# Patient Record
Sex: Male | Born: 1950 | Race: White | Hispanic: No | Marital: Married | Smoking: Former smoker
Health system: Southern US, Community
[De-identification: ages and names within clinical notes are randomized; demographics above are authoritative.]

## PROBLEM LIST (undated history)

## (undated) DIAGNOSIS — K219 Gastro-esophageal reflux disease without esophagitis: Secondary | ICD-10-CM

## (undated) DIAGNOSIS — G459 Transient cerebral ischemic attack, unspecified: Secondary | ICD-10-CM

## (undated) DIAGNOSIS — E785 Hyperlipidemia, unspecified: Secondary | ICD-10-CM

## (undated) HISTORY — DX: Hyperlipidemia, unspecified: E78.5

## (undated) HISTORY — PX: COLONOSCOPY: SHX174

## (undated) HISTORY — PX: ESOPHAGOGASTRODUODENOSCOPY ENDOSCOPY: SHX5814

## (undated) HISTORY — PX: TONSILLECTOMY: SUR1361

## (undated) HISTORY — DX: Transient cerebral ischemic attack, unspecified: G45.9

## (undated) HISTORY — DX: Gastro-esophageal reflux disease without esophagitis: K21.9

## (undated) HISTORY — PX: CERVICAL SPINE SURGERY: SHX589

---

## 2001-09-04 ENCOUNTER — Ambulatory Visit (HOSPITAL_COMMUNITY): Admission: RE | Admit: 2001-09-04 | Discharge: 2001-09-04 | Payer: Self-pay | Admitting: Family Medicine

## 2001-09-04 ENCOUNTER — Encounter: Payer: Self-pay | Admitting: Family Medicine

## 2002-12-15 ENCOUNTER — Ambulatory Visit (HOSPITAL_COMMUNITY): Admission: RE | Admit: 2002-12-15 | Discharge: 2002-12-15 | Payer: Self-pay | Admitting: Family Medicine

## 2002-12-15 ENCOUNTER — Encounter: Payer: Self-pay | Admitting: Family Medicine

## 2005-01-17 ENCOUNTER — Encounter: Admission: RE | Admit: 2005-01-17 | Discharge: 2005-01-17 | Payer: Self-pay | Admitting: *Deleted

## 2005-12-26 ENCOUNTER — Ambulatory Visit: Payer: Self-pay | Admitting: Gastroenterology

## 2006-11-05 ENCOUNTER — Ambulatory Visit: Payer: Self-pay | Admitting: Gastroenterology

## 2006-11-19 ENCOUNTER — Ambulatory Visit: Payer: Self-pay | Admitting: Gastroenterology

## 2010-04-11 ENCOUNTER — Ambulatory Visit (HOSPITAL_BASED_OUTPATIENT_CLINIC_OR_DEPARTMENT_OTHER): Admission: RE | Admit: 2010-04-11 | Discharge: 2010-04-11 | Payer: Self-pay | Admitting: Orthopedic Surgery

## 2010-12-08 LAB — POCT HEMOGLOBIN-HEMACUE: Hemoglobin: 15.6 g/dL (ref 13.0–17.0)

## 2010-12-21 ENCOUNTER — Telehealth: Payer: Self-pay | Admitting: Cardiology

## 2011-01-28 ENCOUNTER — Ambulatory Visit
Admission: RE | Admit: 2011-01-28 | Discharge: 2011-01-28 | Disposition: A | Discharge status: Home / Self Care | Payer: 59 | Source: Ambulatory Visit | Attending: Internal Medicine | Admitting: Internal Medicine

## 2011-01-28 ENCOUNTER — Other Ambulatory Visit: Payer: Self-pay | Admitting: Internal Medicine

## 2011-01-28 DIAGNOSIS — R05 Cough: Secondary | ICD-10-CM

## 2011-01-28 DIAGNOSIS — R059 Cough, unspecified: Secondary | ICD-10-CM

## 2016-10-30 ENCOUNTER — Ambulatory Visit
Admission: RE | Admit: 2016-10-30 | Discharge: 2016-10-30 | Disposition: A | Discharge status: Home / Self Care | Payer: BLUE CROSS/BLUE SHIELD | Source: Ambulatory Visit | Attending: Internal Medicine | Admitting: Internal Medicine

## 2016-10-30 ENCOUNTER — Other Ambulatory Visit: Payer: Self-pay | Admitting: Internal Medicine

## 2016-10-30 DIAGNOSIS — J189 Pneumonia, unspecified organism: Secondary | ICD-10-CM

## 2016-12-31 ENCOUNTER — Encounter: Payer: Self-pay | Admitting: Gastroenterology

## 2017-01-27 ENCOUNTER — Emergency Department (HOSPITAL_COMMUNITY): Payer: BLUE CROSS/BLUE SHIELD

## 2017-01-27 ENCOUNTER — Emergency Department (HOSPITAL_COMMUNITY)
Admission: EM | Admit: 2017-01-27 | Discharge: 2017-01-27 | Disposition: A | Discharge status: Home / Self Care | Payer: BLUE CROSS/BLUE SHIELD | Attending: Emergency Medicine | Admitting: Emergency Medicine

## 2017-01-27 ENCOUNTER — Encounter (HOSPITAL_COMMUNITY): Payer: Self-pay

## 2017-01-27 DIAGNOSIS — R4182 Altered mental status, unspecified: Secondary | ICD-10-CM | POA: Insufficient documentation

## 2017-01-27 DIAGNOSIS — R791 Abnormal coagulation profile: Secondary | ICD-10-CM | POA: Diagnosis not present

## 2017-01-27 DIAGNOSIS — R51 Headache: Secondary | ICD-10-CM | POA: Diagnosis present

## 2017-01-27 LAB — I-STAT CHEM 8, ED
BUN: 26 mg/dL — ABNORMAL HIGH (ref 6–20)
CHLORIDE: 107 mmol/L (ref 101–111)
Calcium, Ion: 1.26 mmol/L (ref 1.15–1.40)
Creatinine, Ser: 1.1 mg/dL (ref 0.61–1.24)
GLUCOSE: 102 mg/dL — AB (ref 65–99)
HEMATOCRIT: 48 % (ref 39.0–52.0)
HEMOGLOBIN: 16.3 g/dL (ref 13.0–17.0)
POTASSIUM: 4.9 mmol/L (ref 3.5–5.1)
Sodium: 137 mmol/L (ref 135–145)
TCO2: 25 mmol/L (ref 0–100)

## 2017-01-27 LAB — CBG MONITORING, ED: Glucose-Capillary: 90 mg/dL (ref 65–99)

## 2017-01-27 LAB — COMPREHENSIVE METABOLIC PANEL
ALT: 26 U/L (ref 17–63)
AST: 24 U/L (ref 15–41)
Albumin: 4.3 g/dL (ref 3.5–5.0)
Alkaline Phosphatase: 53 U/L (ref 38–126)
Anion gap: 7 (ref 5–15)
BUN: 20 mg/dL (ref 6–20)
CHLORIDE: 108 mmol/L (ref 101–111)
CO2: 22 mmol/L (ref 22–32)
CREATININE: 1.04 mg/dL (ref 0.61–1.24)
Calcium: 9.8 mg/dL (ref 8.9–10.3)
GFR calc non Af Amer: >60 mL/min (ref >60)
Glucose, Bld: 106 mg/dL — ABNORMAL HIGH (ref 65–99)
POTASSIUM: 5 mmol/L (ref 3.5–5.1)
SODIUM: 137 mmol/L (ref 135–145)
Total Bilirubin: 1 mg/dL (ref 0.3–1.2)
Total Protein: 6.9 g/dL (ref 6.5–8.1)

## 2017-01-27 LAB — DIFFERENTIAL
BASOS ABS: 0 10*3/uL (ref 0.0–0.1)
BASOS PCT: 0 %
EOS ABS: 0 10*3/uL (ref 0.0–0.7)
Eosinophils Relative: 1 %
Lymphocytes Relative: 46 %
Lymphs Abs: 2.6 10*3/uL (ref 0.7–4.0)
Monocytes Absolute: 0.3 10*3/uL (ref 0.1–1.0)
Monocytes Relative: 5 %
NEUTROS ABS: 2.7 10*3/uL (ref 1.7–7.7)
Neutrophils Relative %: 48 %

## 2017-01-27 LAB — CBC
HCT: 45 % (ref 39.0–52.0)
Hemoglobin: 14.7 g/dL (ref 13.0–17.0)
MCH: 31.5 pg (ref 26.0–34.0)
MCHC: 32.7 g/dL (ref 30.0–36.0)
MCV: 96.4 fL (ref 78.0–100.0)
PLATELETS: 133 10*3/uL — AB (ref 150–400)
RBC: 4.67 MIL/uL (ref 4.22–5.81)
RDW: 13.7 % (ref 11.5–15.5)
WBC: 5.6 10*3/uL (ref 4.0–10.5)

## 2017-01-27 LAB — APTT: APTT: 25 s (ref 24–36)

## 2017-01-27 LAB — PROTIME-INR
INR: 1.01
PROTHROMBIN TIME: 13.3 s (ref 11.4–15.2)

## 2017-01-27 LAB — I-STAT TROPONIN, ED: TROPONIN I, POC: 0 ng/mL (ref 0.00–0.08)

## 2017-01-27 MED ORDER — SODIUM CHLORIDE 0.9 % IV BOLUS (SEPSIS)
1000.0000 mL | Freq: Once | INTRAVENOUS | Status: AC
  Administered 2017-01-27: 1000 mL via INTRAVENOUS

## 2017-01-27 NOTE — ED Notes (Signed)
Pt ambulated in hall with ease

## 2017-01-27 NOTE — ED Notes (Signed)
Pt is stable, A&Ox4, VSS. Pt left without going over paperwork.

## 2017-01-27 NOTE — ED Provider Notes (Signed)
[facility] DEPT Provider Note   CSN: [id number] Arrival date & time: 01/27/17  1538     History   Chief Complaint Chief Complaint  Patient presents with  . Code Stroke    HPI Mike Arias is a 66 y.o. male.   Illness  This is a new problem. The current episode started 3 to 5 hours ago. The problem occurs constantly. Progression since onset: improved. Associated symptoms include headaches. Pertinent negatives include no chest pain, no abdominal pain and no shortness of breath. Nothing aggravates the symptoms. The symptoms are relieved by lying down. He has tried nothing for the symptoms.    History reviewed. No pertinent past medical history.  There are no active problems to display for this patient.   History reviewed. No pertinent surgical history.     Home Medications    Prior to Admission medications   Not on File    Family History No family history on file.  Social History Social History  Substance Use Topics  . Smoking status: Never Smoker  . Smokeless tobacco: Never Used  . Alcohol use Yes     Comment: 4 days a week     Allergies   Patient has no known allergies.   Review of Systems Review of Systems  Respiratory: Negative for shortness of breath.   Cardiovascular: Negative for chest pain.  Gastrointestinal: Negative for abdominal pain.  Neurological: Positive for headaches.  All other systems reviewed and are negative.    Physical Exam Updated Vital Signs BP 131/85   Pulse (!) 52   Resp 14   SpO2 98%   Physical Exam  Constitutional: He is oriented to person, place, and time. He appears well-developed and well-nourished.  HENT:  Head: Normocephalic and atraumatic.  Eyes: Conjunctivae and EOM are normal. Pupils are equal, round, and reactive to light.  Neck: Normal range of motion.  Cardiovascular: Normal rate.   Pulmonary/Chest: Effort normal. No respiratory distress. He has no wheezes.  Abdominal: He exhibits no distension.    Musculoskeletal: Normal range of motion.  Neurological: He is alert and oriented to person, place, and time. A cranial nerve deficit (mild left facial droop most noticeable around mouth and nasolabial fold) is present.  No altered mental status, able to give full seemingly accurate history.  Face is assymmetric only around mouth (left nasolabial fold), EOM's intact, pupils equal and reactive, vision intact, tongue and uvula midline without deviation Upper and Lower extremity motor 5/5, intact pain perception in distal extremities, 2+ reflexes in biceps, patella and achilles tendons. Finger to nose normal, heel to shin normal. Walks without assistance or evident ataxia.    Nursing note and vitals reviewed.    ED Treatments / Results  Labs (all labs ordered are listed, but only abnormal results are displayed) Labs Reviewed  CBC - Abnormal; Notable for the following:       Result Value   Platelets 133 (*)    All other components within normal limits  COMPREHENSIVE METABOLIC PANEL - Abnormal; Notable for the following:    Glucose, Bld 106 (*)    All other components within normal limits  I-STAT CHEM 8, ED - Abnormal; Notable for the following:    BUN 26 (*)    Glucose, Bld 102 (*)    All other components within normal limits  PROTIME-INR  APTT  DIFFERENTIAL  CBG MONITORING, ED  I-STAT TROPOININ, ED  CBG MONITORING, ED    EKG  EKG Interpretation  Date/Time:  Monday  Jan 27 2017 15:51:00 EDT Ventricular Rate:  52 PR Interval:  206 QRS Duration: 104 QT Interval:  446 QTC Calculation: 414 R Axis:   16 Text Interpretation:  Sinus bradycardia Septal infarct , age undetermined Abnormal ECG No significant change since last tracing Confirmed by [facility] MD, Corene Cornea [phone]) on 01/27/2017 4:42:32 PM       Radiology Mr Brain Wo Contrast  Result Date: 01/27/2017 CLINICAL DATA:  Acute onset of headache, RIGHT eye blurriness, and altered sense of well-being. EXAM: MRI HEAD WITHOUT CONTRAST  TECHNIQUE: Multiplanar, multiecho pulse sequences of the brain and surrounding structures were obtained without intravenous contrast. COMPARISON:  CT head 01/27/2017. FINDINGS: The patient was unable to remain motionless for the exam. Small or subtle lesions could be overlooked. Brain: No evidence for acute infarction, hemorrhage, mass lesion, hydrocephalus, or extra-axial fluid. Mild atrophy. Mild subcortical and periventricular T2 and FLAIR hyperintensities, likely chronic microvascular ischemic change. Vascular: Flow voids are maintained throughout the carotid, basilar, and vertebral arteries. There are no areas of chronic hemorrhage. Skull and upper cervical spine: Unremarkable visualized calvarium, skullbase, and cervical vertebrae. Pituitary, pineal, cerebellar tonsils unremarkable. No upper cervical cord lesions. Sinuses/Orbits: No orbital masses or proptosis. Globes appear symmetric. Sinuses appear well aerated, without evidence for air-fluid level. Other: No nasopharyngeal pathology or mastoid fluid. Scalp and other visualized extracranial soft tissues grossly unremarkable. IMPRESSION: Mild atrophy. Mild subcortical and periventricular T2 and FLAIR hyperintensities, likely chronic microvascular ischemic change. No acute intracranial findings are evident. Good general agreement with prior noncontrast CT. Electronically Signed   By: Staci Righter M.D.   On: 01/27/2017 18:54   Ct Head Code Stroke W/o Cm  Result Date: 01/27/2017 CLINICAL DATA:  Code stroke. 66 year old male with altered mental status, confusion and blurred vision. Initial encounter. EXAM: CT HEAD WITHOUT CONTRAST TECHNIQUE: Contiguous axial images were obtained from the base of the skull through the vertex without intravenous contrast. COMPARISON:  Brain MRI 01/17/2005 FINDINGS: Brain: Cerebral volume remains normal for age. No midline shift, ventriculomegaly, mass effect, evidence of mass lesion, intracranial hemorrhage or evidence of  cortically based acute infarction. Gray-white matter differentiation is within normal limits throughout the brain. No cortical encephalomalacia. Vascular: Calcified atherosclerosis at the skull base. No suspicious intracranial vascular hyperdensity. Skull: No acute osseous abnormality identified. Sinuses/Orbits: Visualized paranasal sinuses and mastoids are stable and well pneumatized. Other: Visualized scalp soft tissues are within normal limits. Visualized orbit soft tissues are within normal limits. ASPECTS [facility] Stroke Program Early CT Score) - Ganglionic level infarction (caudate, lentiform nuclei, internal capsule, insula, M1-M3 cortex): 7 - Supraganglionic infarction (M4-M6 cortex): 3 Total score (0-10 with 10 being normal): 10 IMPRESSION: 1. Normal for age non contrast CT appearance of the brain. 2. ASPECTS is 10. 3. The above was relayed via text pager to T. Dakakni on 01/27/2017 at 16:18 . Electronically Signed   By: Genevie Ann M.D.   On: 01/27/2017 16:18    Procedures Procedures (including critical care time)  Medications Ordered in ED Medications  sodium chloride 0.9 % bolus 1,000 mL (1,000 mLs Intravenous New Bag/Given 01/27/17 1712)     Initial Impression / Assessment and Plan / ED Course  I have reviewed the triage vital signs and the nursing notes.  Pertinent labs & imaging results that were available during my care of the patient were reviewed by me and considered in my medical decision making (see chart for details).     Possibly volume related? Hasn't eaten, could be related. Does have  a left facial droop (mild) so will get MRI to ensure no CNS cause. Neuro already examined and thinks it not to be a code stroke.   Doubt stroke, but unsure of cause of symptoms, will need to continue following up with PCP for further workup.   Final Clinical Impressions(s) / ED Diagnoses   Final diagnoses:  Altered mental status, unspecified altered mental status type      Merrily Pew,  MD 01/27/17 1949

## 2017-01-27 NOTE — Code Documentation (Signed)
66 y.o. Male who states he was in his normal state of health this morning when he had an acute onset of H/A, right eye blurriness and "feeling weird". Patient states he was worried he might be having a stroke and decided to come to the ED to get checked out. Pt arrives to [facility] via POV. Code stroke called in triage. Patient met by stroke team in CT. CT showing no acute intracranial abnormalities. ASPECTS 10. NIHSS 1. VAN (-). See EMR for code stroke times and NIHSS. On assessment, patient with slight right sided facial droop which he reports as "normal", mild H/A and mild blurriness to the right eye. No other lateralizing or focal deficits appreciated. tPA not given d/t being too mild to treat. Code stroke canceled per neurologist. ED bedside handoff with ED RN Myriam Jacobson.

## 2017-01-27 NOTE — ED Triage Notes (Signed)
[facility] today and states at 1200 started feeling weird and saw friends but could not remember names, then right eye is blurry, then developed headache.  No extremity deficits.

## 2017-01-27 NOTE — ED Notes (Signed)
CBG is 90.  

## 2017-01-27 NOTE — ED Notes (Signed)
Patient transported to MRI 

## 2017-01-27 NOTE — ED Notes (Signed)
Code Stroke activated @ 0786

## 2017-01-27 NOTE — ED Notes (Signed)
All triage by Elmyra Ricks

## 2017-02-25 ENCOUNTER — Ambulatory Visit (AMBULATORY_SURGERY_CENTER): Payer: Self-pay | Admitting: *Deleted

## 2017-02-25 ENCOUNTER — Encounter: Payer: Self-pay | Admitting: Gastroenterology

## 2017-02-25 VITALS — Ht 74.0 in | Wt 206.3 lb

## 2017-02-25 DIAGNOSIS — Z1211 Encounter for screening for malignant neoplasm of colon: Secondary | ICD-10-CM

## 2017-02-25 MED ORDER — NA SULFATE-K SULFATE-MG SULF 17.5-3.13-1.6 GM/177ML PO SOLN: ORAL | 0 refills | Status: DC

## 2017-02-25 NOTE — Progress Notes (Signed)
Pt denies allergies to eggs or soy products. Denies difficulty with sedation or anesthesia. Denies any diet or weight loss medications. Denies use of supplemental oxygen.  Emmi instructions given for procedure.  

## 2017-03-11 ENCOUNTER — Encounter: Payer: Self-pay | Admitting: Gastroenterology

## 2017-03-11 ENCOUNTER — Ambulatory Visit (AMBULATORY_SURGERY_CENTER): Payer: BLUE CROSS/BLUE SHIELD | Admitting: Gastroenterology

## 2017-03-11 VITALS — BP 122/69 | HR 41 | Temp 98.6°F | Resp 9 | Ht 74.0 in | Wt 206.0 lb

## 2017-03-11 DIAGNOSIS — D128 Benign neoplasm of rectum: Secondary | ICD-10-CM

## 2017-03-11 DIAGNOSIS — D120 Benign neoplasm of cecum: Secondary | ICD-10-CM

## 2017-03-11 DIAGNOSIS — D123 Benign neoplasm of transverse colon: Secondary | ICD-10-CM

## 2017-03-11 DIAGNOSIS — D122 Benign neoplasm of ascending colon: Secondary | ICD-10-CM

## 2017-03-11 DIAGNOSIS — D124 Benign neoplasm of descending colon: Secondary | ICD-10-CM

## 2017-03-11 DIAGNOSIS — Z1212 Encounter for screening for malignant neoplasm of rectum: Secondary | ICD-10-CM | POA: Diagnosis not present

## 2017-03-11 DIAGNOSIS — Z1211 Encounter for screening for malignant neoplasm of colon: Secondary | ICD-10-CM

## 2017-03-11 MED ORDER — SODIUM CHLORIDE 0.9 % IV SOLN: 500.0000 mL | INTRAVENOUS | Status: DC

## 2017-03-11 NOTE — Op Note (Signed)
[facility] Patient Name: Mike Arias Procedure Date: 03/11/2017 10:12 AM MRN: [id number] Endoscopist: Mallie Mussel L. Loletha Carrow , MD Age: 66 Referring MD:  Date of Birth: April 14, 1951 Gender: Male Account #: [id number] Procedure:                Colonoscopy Indications:              Screening for colorectal malignant neoplasm (no                            polyps on 10/2006 colonoscopy) Medicines:                Monitored Anesthesia Care Procedure:                Pre-Anesthesia Assessment:                           - Prior to the procedure, a History and Physical                            was performed, and patient medications and                            allergies were reviewed. The patient's tolerance of                            previous anesthesia was also reviewed. The risks                            and benefits of the procedure and the sedation                            options and risks were discussed with the patient.                            All questions were answered, and informed consent                            was obtained. Prior Anticoagulants: The patient has                            taken no previous anticoagulant or antiplatelet                            agents. ASA Grade Assessment: II - A patient with                            mild systemic disease. After reviewing the risks                            and benefits, the patient was deemed in                            satisfactory condition to undergo the procedure.  After obtaining informed consent, the colonoscope                            was passed under direct vision. Throughout the                            procedure, the patient's blood pressure, pulse, and                            oxygen saturations were monitored continuously. The                            Model CF-HQ190L [id number]) scope was introduced                            through the anus and advanced to  the the cecum,                            identified by appendiceal orifice and ileocecal                            valve. The colonoscopy was performed without                            difficulty. The patient tolerated the procedure                            well. The quality of the bowel preparation was                            excellent after lavage. The ileocecal valve,                            appendiceal orifice, and rectum were photographed.                            The quality of the bowel preparation was evaluated                            using the BBPS [facility] Bowel Preparation Scale)                            with scores of: Right Colon = 3, Transverse Colon =                            3 and Left Colon = 3 (entire mucosa seen well with                            no residual staining, small fragments of stool or                            opaque liquid). The total BBPS score equals 9. The  bowel preparation used was SUPREP. Scope In: 10:27:31 AM Scope Out: 70:62:37 AM Scope Withdrawal Time: 0 hours 15 minutes 53 seconds  Total Procedure Duration: 0 hours 18 minutes 47 seconds  Findings:                 The perianal and digital rectal examinations were                            normal.                           Four sessile polyps were found in the descending                            colon, transverse colon, ascending colon and cecum.                            The polyps were 2 to 4 mm in size. These polyps                            were removed with a cold snare. Resection and                            retrieval were complete.                           A 8 mm polyp was found in the rectum. The polyp was                            semi-sessile. The polyp was removed with a hot                            snare. Resection and retrieval were complete.                           The exam was otherwise without abnormality on                             direct and retroflexion views. Complications:            No immediate complications. Estimated Blood Loss:     Estimated blood loss: none. Impression:               - Four 2 to 4 mm polyps in the descending colon, in                            the transverse colon, in the ascending colon and in                            the cecum, removed with a cold snare. Resected and                            retrieved.                           -  One 8 mm polyp in the rectum, removed with a hot                            snare. Resected and retrieved.                           - The examination was otherwise normal on direct                            and retroflexion views. Recommendation:           - Patient has a contact number available for                            emergencies. The signs and symptoms of potential                            delayed complications were discussed with the                            patient. Return to normal activities tomorrow.                            Written discharge instructions were provided to the                            patient.                           - Resume previous diet.                           - Continue present medications.                           - No aspirin, ibuprofen, naproxen, or other                            non-steroidal anti-inflammatory drugs for 5 days                            after polyp removal.                           - Await pathology results.                           - Repeat colonoscopy is recommended for                            surveillance. The colonoscopy date will be                            determined after pathology results from today's                            exam become  available for review. Henry L. Loletha Carrow, MD 03/11/2017 10:57:37 AM This report has been signed electronically.

## 2017-03-11 NOTE — Progress Notes (Signed)
Report to PACU, RN, vss, BBS= Clear.  

## 2017-03-11 NOTE — Patient Instructions (Signed)
  NO ASPIRIN, ASPIRIN PRODUCTS AND NSAIDS ( MOTRIN, ADVIL, IBUPROFEN, NAPROSYN, ALEVE ETC) FOR 5 DAYS, jUNE 24,2018   YOU HAD AN ENDOSCOPIC PROCEDURE TODAY AT [facility]:   Refer to the procedure report that was given to you for any specific questions about what was found during the examination.  If the procedure report does not answer your questions, please call your gastroenterologist to clarify.  If you requested that your care partner not be given the details of your procedure findings, then the procedure report has been included in a sealed envelope for you to review at your convenience later.  YOU SHOULD EXPECT: Some feelings of bloating in the abdomen. Passage of more gas than usual.  Walking can help get rid of the air that was put into your GI tract during the procedure and reduce the bloating. If you had a lower endoscopy (such as a colonoscopy or flexible sigmoidoscopy) you may notice spotting of blood in your stool or on the toilet paper. If you underwent a bowel prep for your procedure, you may not have a normal bowel movement for a few days.  Please Note:  You might notice some irritation and congestion in your nose or some drainage.  This is from the oxygen used during your procedure.  There is no need for concern and it should clear up in a day or so.  SYMPTOMS TO REPORT IMMEDIATELY:   Following lower endoscopy (colonoscopy or flexible sigmoidoscopy):  Excessive amounts of blood in the stool  Significant tenderness or worsening of abdominal pains  Swelling of the abdomen that is new, acute  Fever of 100F or higher   For urgent or emergent issues, a gastroenterologist can be reached at any hour by calling [phone].   DIET:  We do recommend a small meal at first, but then you may proceed to your regular diet.  Drink plenty of fluids but you should avoid alcoholic beverages for 24 hours.  ACTIVITY:  You should plan to take it easy for the rest of today  and you should NOT DRIVE or use heavy machinery until tomorrow (because of the sedation medicines used during the test).    FOLLOW UP: Our staff will call the number listed on your records the next business day following your procedure to check on you and address any questions or concerns that you may have regarding the information given to you following your procedure. If we do not reach you, we will leave a message.  However, if you are feeling well and you are not experiencing any problems, there is no need to return our call.  We will assume that you have returned to your regular daily activities without incident.  If any biopsies were taken you will be contacted by phone or by letter within the next 1-3 weeks.  Please call us at [phone] if you have not heard about the biopsies in 3 weeks.    SIGNATURES/CONFIDENTIALITY: You and/or your care partner have signed paperwork which will be entered into your electronic medical record.  These signatures attest to the fact that that the information above on your After Visit Summary has been reviewed and is understood.  Full responsibility of the confidentiality of this discharge information lies with you and/or your care-partner.

## 2017-03-12 ENCOUNTER — Telehealth: Payer: Self-pay | Admitting: *Deleted

## 2017-03-12 NOTE — Telephone Encounter (Signed)
   Follow up Call-  Call back number 03/11/2017  Post procedure Call Back phone  # [phone]  Permission to leave phone message Yes  Some recent data might be hidden     Patient questions:  Do you have a fever, pain , or abdominal swelling? No. Pain Score  0 *  Have you tolerated food without any problems? Yes.    Have you been able to return to your normal activities? Yes.    Do you have any questions about your discharge instructions: Diet   No. Medications  No. Follow up visit  No.  Do you have questions or concerns about your Care? No.  Actions: * If pain score is 4 or above: No action needed, pain <4.

## 2017-03-14 ENCOUNTER — Encounter: Payer: Self-pay | Admitting: Gastroenterology

## 2018-06-08 DIAGNOSIS — M25561 Pain in right knee: Secondary | ICD-10-CM | POA: Insufficient documentation

## 2018-06-10 DIAGNOSIS — M1712 Unilateral primary osteoarthritis, left knee: Secondary | ICD-10-CM | POA: Insufficient documentation

## 2018-06-10 DIAGNOSIS — M1711 Unilateral primary osteoarthritis, right knee: Secondary | ICD-10-CM | POA: Insufficient documentation

## 2018-07-02 DIAGNOSIS — M1611 Unilateral primary osteoarthritis, right hip: Secondary | ICD-10-CM | POA: Insufficient documentation

## 2018-07-02 DIAGNOSIS — M7070 Other bursitis of hip, unspecified hip: Secondary | ICD-10-CM | POA: Insufficient documentation

## 2018-07-02 DIAGNOSIS — M25559 Pain in unspecified hip: Secondary | ICD-10-CM | POA: Insufficient documentation

## 2018-12-04 ENCOUNTER — Ambulatory Visit (INDEPENDENT_AMBULATORY_CARE_PROVIDER_SITE_OTHER)
Admission: RE | Admit: 2018-12-04 | Discharge: 2018-12-04 | Disposition: A | Discharge status: Home / Self Care | Payer: Medicare HMO | Source: Ambulatory Visit | Attending: Cardiovascular Disease | Admitting: Cardiovascular Disease

## 2018-12-04 ENCOUNTER — Other Ambulatory Visit: Payer: Self-pay

## 2018-12-04 ENCOUNTER — Ambulatory Visit (HOSPITAL_COMMUNITY)
Admission: RE | Admit: 2018-12-04 | Discharge: 2018-12-04 | Disposition: A | Discharge status: Home / Self Care | Payer: Medicare HMO | Source: Ambulatory Visit | Attending: Cardiology | Admitting: Cardiology

## 2018-12-04 ENCOUNTER — Telehealth: Payer: Self-pay

## 2018-12-04 DIAGNOSIS — G459 Transient cerebral ischemic attack, unspecified: Secondary | ICD-10-CM

## 2018-12-04 NOTE — Telephone Encounter (Signed)
Patient reports having an episode on Monday where he was driving with one of his longtime friends to a conference to give a speech and suddenly became unable to verbalize his name. He states that it lasted for about 15 minutes and then resolved. Patient states that his vision was a little blurry at that time and shortly after. Patient denies having HA or any changes in his speech or strength or any other Sx. Patient denies having any Sx at this time and states that he is back to his regular self. Patient states that he has been taking ASA. Arranged for patient to have Non-Contrast Head CT and Carotid US today. Arranged for patient to have echo  on 3/16 at 7:30 AM and follow up appointment with Dr. Acie Fredrickson same day at 10:40 AM. Strict ER precautions given to patient. Patient verbalized understanding.

## 2018-12-06 ENCOUNTER — Encounter: Payer: Self-pay | Admitting: Cardiovascular Disease

## 2018-12-06 NOTE — Progress Notes (Signed)
Cardiology Office Note:    Date:  12/07/2018   ID:  Mike Arias, DOB 05/23/1951, MRN [id number]  PCP:  Levin Erp, MD  Cardiologist:  Mertie Moores, MD  Electrophysiologist:  None   Referring MD: Levin Erp, MD   Chief Complaint  Patient presents with  . Cerebrovascular Accident     December 07, 2018    Mike Arias is a 68 y.o. male with a recent  hx of a TIA. He is self referred ( friend and neighbor of Mike Arias, [place])   Mike Arias had an episode of inability to speak correctly  1 week ago The symptoms lasted 15 minutes - 30 min  and have not returned No weakness or numbness A few min of blurry vision No Cp , no dyspnea.    Lipids are elevated.   Checks his BP at the [facility] ,  Is occasionally elevated  Does not eat much salt  On no meds   Swam his 1.25 miles the next day without any issues   CT of the head was negative.  Carotid duplex revealed mild plaque.  [place], , heating oil and propane .     Went to [employer].  Swam  stuideied busienss     Past Medical History:  Diagnosis Date  . GERD (gastroesophageal reflux disease)     Past Surgical History:  Procedure Laterality Date  . CERVICAL SPINE SURGERY    . COLONOSCOPY    . ESOPHAGOGASTRODUODENOSCOPY ENDOSCOPY    . TONSILLECTOMY      Current Medications: Current Meds  Medication Sig  . aspirin 325 MG tablet Take 325 mg by mouth daily.   Current Facility-Administered Medications for the 12/07/18 encounter (Office Visit) with Soham Hollett, Wonda Cheng, MD  Medication  . 0.9 %  sodium chloride infusion     Allergies:   Patient has no known allergies.   Social History   Socioeconomic History  . Marital status: Married    Spouse name: Not on file  . Number of children: Not on file  . Years of education: Not on file  . Highest education level: Not on file  Occupational History  . Not on file  Social Needs  . Financial resource strain: Not on file  . Food insecurity:    Worry: Not on file   Inability: Not on file  . Transportation needs:    Medical: Not on file    Non-medical: Not on file  Tobacco Use  . Smoking status: Never Smoker  . Smokeless tobacco: Never Used  Substance and Sexual Activity  . Alcohol use: Yes    Comment: 4 days a week  . Drug use: No  . Sexual activity: Not on file  Lifestyle  . Physical activity:    Days per week: Not on file    Minutes per session: Not on file  . Stress: Not on file  Relationships  . Social connections:    Talks on phone: Not on file    Gets together: Not on file    Attends religious service: Not on file    Active member of club or organization: Not on file    Attends meetings of clubs or organizations: Not on file    Relationship status: Not on file  Other Topics Concern  . Not on file  Social History Narrative  . Not on file     Family History: The patient's family history includes Lung cancer in his father. There is no history  of Colon cancer.  ROS:   Please see the history of present illness.     All other systems reviewed and are negative.  EKGs/Labs/Other Studies Reviewed:    The following studies were reviewed today:   EKG: December 07, 2018: Sinus bradycardia 51 beats minute.  Otherwise normal EKG.  Recent Labs: No results found for requested labs within last 8760 hours.  Recent Lipid Panel No results found for: CHOL, TRIG, HDL, CHOLHDL, VLDL, LDLCALC, LDLDIRECT  Physical Exam:    VS:  BP 140/82   Pulse (!) 51   Ht 6\' 2"  (1.88 m)   Wt 203 lb 3.2 oz (92.2 kg)   SpO2 99%   BMI 26.09 kg/m     Wt Readings from Last 3 Encounters:  12/07/18 203 lb 3.2 oz (92.2 kg)  03/11/17 206 lb (93.4 kg)  02/25/17 206 lb 4.8 oz (93.6 kg)     GEN:  Well nourished, well developed in no acute distress HEENT: Normal NECK: No JVD; No carotid bruits LYMPHATICS: No lymphadenopathy CARDIAC: RRR, no murmurs, rubs, gallops RESPIRATORY:  Clear to auscultation without rales, wheezing or rhonchi  ABDOMEN: Soft,  non-tender, non-distended MUSCULOSKELETAL:  No edema; No deformity  SKIN: Warm and dry NEUROLOGIC:  Alert and oriented x 3 PSYCHIATRIC:  Normal affect   ASSESSMENT:    1. TIA (transient ischemic attack)   2. Mixed hyperlipidemia    PLAN:    In order of problems listed above:  1. TIA - Mike Arias  had a TIA last Monday.  Urgent noncontrast CT of the head looked unremarkable.  Carotid duplex scan revealed mild bilateral rotted plaques.  His symptoms have completely resolved.  Like to discuss with neuro as to the next step in work-up.  And to see if we should continue high-dose aspirin or perhaps should try Plavix.  Has a history of hyperlipidemia which we will start treating today.  2. Hyperlipidemia : His last lipids were very elevated..  In March, his total cholesterol was 245.  The LDL is 177.  The HDL is 52.  His triglyceride level 62.  Will start him on Crestor 10 mg a day.  We will check labs in 3 months.  I will see him again in 3 months for follow-up visit.   Medication Adjustments/Labs and Tests Ordered: Current medicines are reviewed at length with the patient today.  Concerns regarding medicines are outlined above.  Orders Placed This Encounter  Procedures  . Lipid Profile  . Basic Metabolic Panel (BMET)  . Hepatic function panel  . EKG 12-Lead   Meds ordered this encounter  Medications  . rosuvastatin (CRESTOR) 10 MG tablet    Sig: Take 1 tablet (10 mg total) by mouth daily.    Dispense:  90 tablet    Refill:  3    Patient Instructions  Medication Instructions:  Your physician has recommended you make the following change in your medication:  START Crestor (Rosuvastatin) 10 mg once daily  If you need a refill on your cardiac medications before your next appointment, please call your pharmacy.   Lab work: Your physician recommends that you return for lab work in: 3 months on the day of or a few days before your office visit with Dr. Acie Fredrickson.  You will need to  FAST for this appointment - nothing to eat or drink after midnight the night before except water.    Testing/Procedures: None Ordered    Follow-Up: At [facility], you and your health needs are  our priority.  As part of our continuing mission to provide you with exceptional heart care, we have created designated Provider Care Teams.  These Care Teams include your primary Cardiologist (physician) and Advanced Practice Providers (APPs -  Physician Assistants and Nurse Practitioners) who all work together to provide you with the care you need, when you need it. You will need a follow up appointment in:  3 months.  Please call our office 2 months in advance to schedule this appointment.  You may see Mertie Moores, MD or one of the following Advanced Practice Providers on your designated Care Team: Richardson Dopp, PA-C [place], [place] . Daune Perch, NP      Signed, Mertie Moores, MD  12/07/2018 11:25 AM    [place]

## 2018-12-07 ENCOUNTER — Ambulatory Visit (HOSPITAL_COMMUNITY): Payer: Medicare HMO | Attending: Cardiology

## 2018-12-07 ENCOUNTER — Encounter: Payer: Self-pay | Admitting: Cardiovascular Disease

## 2018-12-07 ENCOUNTER — Ambulatory Visit: Payer: Medicare HMO | Admitting: Cardiovascular Disease

## 2018-12-07 ENCOUNTER — Other Ambulatory Visit: Payer: Self-pay

## 2018-12-07 VITALS — BP 140/82 | HR 51 | Ht 74.0 in | Wt 203.2 lb

## 2018-12-07 DIAGNOSIS — E782 Mixed hyperlipidemia: Secondary | ICD-10-CM

## 2018-12-07 DIAGNOSIS — G459 Transient cerebral ischemic attack, unspecified: Secondary | ICD-10-CM

## 2018-12-07 MED ORDER — ROSUVASTATIN CALCIUM 10 MG PO TABS: 10.0000 mg | ORAL_TABLET | Freq: Every day | ORAL | 3 refills | Status: DC

## 2018-12-07 NOTE — Patient Instructions (Addendum)
Medication Instructions:  Your physician has recommended you make the following change in your medication:  START Crestor (Rosuvastatin) 10 mg once daily  If you need a refill on your cardiac medications before your next appointment, please call your pharmacy.    Lab work: Your physician recommends that you return for lab work in: 3 months on the day of or a few days before your office visit with Dr. Acie Fredrickson.  You will need to FAST for this appointment - nothing to eat or drink after midnight the night before except water.    Testing/Procedures: None Ordered    Follow-Up: Your physician recommends that you return for a follow-up appointment on Tuesday June 9 at 8:40 am with Dr. Acie Fredrickson

## 2018-12-09 ENCOUNTER — Telehealth: Payer: Self-pay | Admitting: Neurology

## 2018-12-09 NOTE — Telephone Encounter (Signed)
Patient scheduled Friday at noon, spoke to Dr Acie Fredrickson, referral for TIA.

## 2018-12-11 ENCOUNTER — Encounter: Payer: Self-pay | Admitting: Neurology

## 2018-12-11 ENCOUNTER — Ambulatory Visit: Payer: Medicare HMO | Admitting: Neurology

## 2018-12-11 ENCOUNTER — Telehealth: Payer: Self-pay | Admitting: Neurology

## 2018-12-11 ENCOUNTER — Other Ambulatory Visit: Payer: Self-pay

## 2018-12-11 VITALS — BP 128/78 | HR 53 | Ht 72.0 in | Wt 201.0 lb

## 2018-12-11 DIAGNOSIS — R400 Somnolence: Secondary | ICD-10-CM

## 2018-12-11 DIAGNOSIS — R0683 Snoring: Secondary | ICD-10-CM

## 2018-12-11 DIAGNOSIS — G459 Transient cerebral ischemic attack, unspecified: Secondary | ICD-10-CM | POA: Diagnosis not present

## 2018-12-11 DIAGNOSIS — F4489 Other dissociative and conversion disorders: Secondary | ICD-10-CM | POA: Diagnosis not present

## 2018-12-11 DIAGNOSIS — Z8489 Family history of other specified conditions: Secondary | ICD-10-CM

## 2018-12-11 DIAGNOSIS — R4701 Aphasia: Secondary | ICD-10-CM

## 2018-12-11 NOTE — Telephone Encounter (Signed)
Called the patient about todays apt. He is scheduled to come in for apt at 60. I called to give the patient the option to come in at a later time given his age and increased risk for exposure. lvm giving the patient the option and requested to call us and let us know what he would prefer.

## 2018-12-11 NOTE — Telephone Encounter (Signed)
Pt called in and said he will be here at 11:30 today and wants to keep his appt dg

## 2018-12-11 NOTE — Progress Notes (Signed)
[facility]    Provider:  Dr Jaynee Eagles Requesting Provider: Dr. Mertie Moores Primary Care Provider:  Levin Erp, MD  CC:  TIA  HPI:  Mike Arias is a 68 y.o. male here as requested by Dr. Mertie Moores for TIA.  Past medical history sinus bradycardia 51 bpm otherwise normal EKG.  He was started on Crestor.  He is on aspirin high-dose 325 mg. 3 years ao he was riding down the road and something wasn't right, had a similar episode of aphasia. Several weeks ago has a similar incident: He was on his way to work very similar, became forgetful. He was riding and driving, he wasn't able to respond, he forgot his colleagues name. Was still able to drive, he remembers the whole episode, he couldn;t carry on a conversation. He could talk but he felt like it was uncomfortable for him to say hello for example, but he could talk but not comprehending. He went into thie meeting, didn;t know people's names, didn't comprehend a lot but by the time the keeting was over he was fine. Lasted about 15 minutes. No one even noticed, maybe one person looked at him. No other focal neurologic deficits. Lasted 5-20 minutes. Had exactly the same incident several years ago. Was not on aspirin until last Monday. He was also just started on cholesterol medication. Incident several years ago was exactly the same. No shortness of breath, no lightheadedness. Family with a history seizures, brother and sister. No other focal neurologic deficits, associated symptoms, inciting events or modifiable factors.   Reviewed notes, labs and imaging from outside physicians, which showed:  Pending BMP, lipid profile.  No hemoglobin A1c.  Reviewed Dr. Arnette Norris Nahser's notes.  Patient had an episode of inability to speak correctly a week prior to being seen December 07, 2018.  The symptoms lasted 15 to 30 minutes and have not returned.  No weakness or numbness.  A few minutes of blurry vision.  No chest pain, no dyspnea.   Cholesterol is elevated.  Blood pressure is occasionally elevated as well per patient.  Does not eat much salt.  He swam 1.25 miles the next day without any issues.  CT of the head was negative.  Carotid duplex revealed mild plaque.  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   CT head 12/04/2018: showed No acute intracranial abnormalities including mass lesion or mass effect, hydrocephalus, extra-axial fluid collection, midline shift, hemorrhage, or acute infarction, large ischemic events (personally reviewed images). +Small-vessel  Whit matter disease. MRI in the past 2018 consistent with above findings as well.      Review of Systems: Patient complains of symptoms per HPI as well as the following symptoms: no CP, no SOB. Pertinent negatives and positives per HPI. All others negative.   Social History   Socioeconomic History   Marital status: Married    Spouse name: Not on file   Number of children: Not on file   Years of education: Not on file   Highest education level: Not on file  Occupational History   Not on file  Social Needs   Financial resource strain: Not on file   Food insecurity:    Worry: Not on file    Inability: Not on file   Transportation needs:    Medical: Not on file    Non-medical: Not on file  Tobacco Use   Smoking status: Never Smoker   Smokeless tobacco: Never Used   Tobacco comment: maybe a cigar  on occasion  Substance and Sexual Activity   Alcohol use: Yes    Comment: 4 days a week   Drug use: No   Sexual activity: Not on file  Lifestyle   Physical activity:    Days per week: Not on file    Minutes per session: Not on file   Stress: Not on file  Relationships   Social connections:    Talks on phone: Not on file    Gets together: Not on file    Attends religious service: Not on file    Active member of club or organization: Not on file    Attends meetings of clubs or organizations: Not on file    Relationship status:  Not on file   Intimate partner violence:    Fear of current or ex partner: Not on file    Emotionally abused: Not on file    Physically abused: Not on file    Forced sexual activity: Not on file  Other Topics Concern   Not on file  Social History Narrative   Not on file    Family History  Problem Relation Age of Onset   Lung cancer Father    Colon cancer Neg Hx     Past Medical History:  Diagnosis Date   GERD (gastroesophageal reflux disease)    HLD (hyperlipidemia)    TIA (transient ischemic attack)    ? TIA    Patient Active Problem List   Diagnosis Date Noted   TIA (transient ischemic attack) 12/14/2018    Past Surgical History:  Procedure Laterality Date   CERVICAL SPINE SURGERY     COLONOSCOPY     ESOPHAGOGASTRODUODENOSCOPY ENDOSCOPY     TONSILLECTOMY      Current Outpatient Medications  Medication Sig Dispense Refill   aspirin 325 MG tablet Take 325 mg by mouth daily.     rosuvastatin (CRESTOR) 10 MG tablet Take 1 tablet (10 mg total) by mouth daily. 90 tablet 3   Current Facility-Administered Medications  Medication Dose Route Frequency Provider Last Rate Last Dose   0.9 %  sodium chloride infusion  500 mL Intravenous Continuous Nelida Meuse III, MD        Allergies as of 12/11/2018   (No Known Allergies)    Vitals: BP 128/78    Pulse (!) 53    Ht 6' (1.829 m)    Wt 201 lb (91.2 kg)    BMI 27.26 kg/m  Last Weight:  Wt Readings from Last 1 Encounters:  12/11/18 201 lb (91.2 kg)   Last Height:   Ht Readings from Last 1 Encounters:  12/11/18 6' (1.829 m)     Physical exam: Exam: Gen: NAD, conversant, well nourised, overweight , well groomed                     CV: RRR, no MRG. No Carotid Bruits. No peripheral edema, warm, nontender Eyes: Conjunctivae clear without exudates or hemorrhage  Neuro: Detailed Neurologic Exam  Speech:    Speech is normal; fluent and spontaneous with normal comprehension.  Cognition:    The  patient is oriented to person, place, and time;     recent and remote memory intact;     language fluent;     normal attention, concentration,     fund of knowledge Cranial Nerves:    The pupils are equal, round, and reactive to light. Attempted fundoscopy could not visualize due to small pupils. Visual fields are full to  finger confrontation. Extraocular movements are intact. Trigeminal sensation is intact and the muscles of mastication are normal. The face is symmetric. The palate elevates in the midline. Hearing intact. Voice is normal. Shoulder shrug is normal. The tongue has normal motion without fasciculations.   Coordination:    Normal finger to nose   Gait:    Normal native gait  Motor Observation:    No asymmetry, no atrophy, and no involuntary movements noted. Tone:    Normal muscle tone.    Posture:    Posture is normal. normal erect    Strength:    Strength is V/V in the upper and lower limbs.      Sensation: intact to LT     Reflex Exam:  DTR's:    Deep tendon reflexes in the upper and lower extremities are symmetrical bilaterally.   Toes:    The toes are downgoing bilaterally.   Clonus:    Clonus is absent.    Assessment/Plan:  68 y.o. male here as requested by Dr. Mertie Moores for TIA.  Past medical history sinus bradycardia 51 bpm otherwise normal EKG.  He was started on Crestor.  He is on aspirin high-dose 325 mg. He has had 2 episodes over the last 3 years of 15-20 minutes of expressive aphasia and possibly some confusion (couldn't remember his colleagues name) and some receptive aphasia (couldn't understand what was being said in the meetings). Lasted 15 minutes. Denies any loss of awareness or consciousness an was still able to walk and navigate and speak sparsely but clearly.  Appears to be TIA and given cortical symptoms would suspect embolic source. However very unusual that he would have repeated episodes of the same exact symptoms if was TIA caused by  embolic source. Need to consider Seizure as well.  30-day heart monitor for afib (and may consider loop recorder) Routine EEG (He declines extended ambulatory EEG) MRI of the brain and MRA of the head Sleep Apnea referral - ESS 10, risk factor for stroke pcp Dr. Nyoka Cowden will get lab results Goal ldl < 70 Goal Blood Pressure 120/80 Continue aspirin and crestor  Discussed: I had a long d/w patient about his recent TIA, risk for recurrent stroke/TIAs, personally independently reviewed imaging studies and stroke evaluation results and answered questions.Continue ASA for secondary stroke prevention and maintain strict control of hypertension with blood pressure goal below 130/90, diabetes with hemoglobin A1c goal below 6.5% and lipids with LDL cholesterol goal below 70 mg/dL. I also advised the patient to eat a healthy diet with plenty of whole grains, cereals, fruits and vegetables, exercise regularly and maintain ideal body weight.     Orders Placed This Encounter  Procedures   MR BRAIN W WO CONTRAST   MR MRA HEAD WO CONTRAST   Ambulatory referral to Sleep Studies   CARDIAC EVENT MONITOR   EEG     Cc: Levin Erp, MD,  Dr. Acie Fredrickson, MD  Sarina Ill, MD  [facility] [address] [place], [place] [address]  Phone [phone] Fax [phone]

## 2018-12-11 NOTE — Patient Instructions (Addendum)
Routine EEG in the office (then possibly a extended eeg) 30-day heart monitor (and may consider loop recorder) MRI of the brain and MRA of the head Sleep Apnea referral possibly pcp Dr. Nyoka Cowden will get lab results Goal ldl < 70 Goal Blood Pressure 120/80 Continue aspirin and crestor  Sleep Apnea Sleep apnea is a condition in which breathing pauses or becomes shallow during sleep. Episodes of sleep apnea usually last 10 seconds or longer, and they may occur as many as 20 times an hour. Sleep apnea disrupts your sleep and keeps your body from getting the rest that it needs. This condition can increase your risk of certain health problems, including:  Heart attack.  Stroke.  Obesity.  Diabetes.  Heart failure.  Irregular heartbeat. There are three kinds of sleep apnea:  Obstructive sleep apnea. This kind is caused by a blocked or collapsed airway.  Central sleep apnea. This kind happens when the part of the brain that controls breathing does not send the correct signals to the muscles that control breathing.  Mixed sleep apnea. This is a combination of obstructive and central sleep apnea. What are the causes? The most common cause of this condition is a collapsed or blocked airway. An airway can collapse or become blocked if:  Your throat muscles are abnormally relaxed.  Your tongue and tonsils are larger than normal.  You are overweight.  Your airway is smaller than normal. What increases the risk? This condition is more likely to develop in people who:  Are overweight.  Smoke.  Have a smaller than normal airway.  Are elderly.  Are male.  Drink alcohol.  Take sedatives or tranquilizers.  Have a family history of sleep apnea. What are the signs or symptoms? Symptoms of this condition include:  Trouble staying asleep.  Daytime sleepiness and tiredness.  Irritability.  Loud snoring.  Morning headaches.  Trouble  concentrating.  Forgetfulness.  Decreased interest in sex.  Unexplained sleepiness.  Mood swings.  Personality changes.  Feelings of depression.  Waking up often during the night to urinate.  Dry mouth.  Sore throat. How is this diagnosed? This condition may be diagnosed with:  A medical history.  A physical exam.  A series of tests that are done while you are sleeping (sleep study). These tests are usually done in a sleep lab, but they may also be done at home. How is this treated? Treatment for this condition aims to restore normal breathing and to ease symptoms during sleep. It may involve managing health issues that can affect breathing, such as high blood pressure or obesity. Treatment may include:  Sleeping on your side.  Using a decongestant if you have nasal congestion.  Avoiding the use of depressants, including alcohol, sedatives, and narcotics.  Losing weight if you are overweight.  Making changes to your diet.  Quitting smoking.  Using a device to open your airway while you sleep, such as: ? An oral appliance. This is a custom-made mouthpiece that shifts your lower jaw forward. ? A continuous positive airway pressure (CPAP) device. This device delivers oxygen to your airway through a mask. ? A nasal expiratory positive airway pressure (EPAP) device. This device has valves that you put into each nostril. ? A bi-level positive airway pressure (BPAP) device. This device delivers oxygen to your airway through a mask.  Surgery if other treatments do not work. During surgery, excess tissue is removed to create a wider airway. It is important to get treatment for sleep apnea.  Without treatment, this condition can lead to:  High blood pressure.  Coronary artery disease.  (Men) An inability to achieve or maintain an erection (impotence).  Reduced thinking abilities. Follow these instructions at home:  Make any lifestyle changes that your health care  provider recommends.  Eat a healthy, well-balanced diet.  Take over-the-counter and prescription medicines only as told by your health care provider.  Avoid using depressants, including alcohol, sedatives, and narcotics.  Take steps to lose weight if you are overweight.  If you were given a device to open your airway while you sleep, use it only as told by your health care provider.  Do not use any tobacco products, such as cigarettes, chewing tobacco, and e-cigarettes. If you need help quitting, ask your health care provider.  Keep all follow-up visits as told by your health care provider. This is important. Contact a health care provider if:  The device that you received to open your airway during sleep is uncomfortable or does not seem to be working.  Your symptoms do not improve.  Your symptoms get worse. Get help right away if:  You develop chest pain.  You develop shortness of breath.  You develop discomfort in your back, arms, or stomach.  You have trouble speaking.  You have weakness on one side of your body.  You have drooping in your face. These symptoms may represent a serious problem that is an emergency. Do not wait to see if the symptoms will go away. Get medical help right away. Call your local emergency services (911 in the [place] Do not drive yourself to the hospital. This information is not intended to replace advice given to you by your health care provider. Make sure you discuss any questions you have with your health care provider. Document Released: 08/30/2002 Document Revised: 04/07/2017 Document Reviewed: 06/19/2015 Elsevier Interactive Patient Education  2019 [employer].  Atrial Fibrillation Atrial fibrillation is a type of irregular or rapid heartbeat (arrhythmia). In atrial fibrillation, the top part of the heart (atria) quivers in a chaotic pattern. This makes the heart unable to pump blood normally. Having atrial fibrillation can increase your  risk for other health problems, such as:  Blood can pool in the atria and form clots. If a clot travels to the brain, it can cause a stroke.  The heart muscle may weaken from the irregular blood flow. This can cause heart failure. Atrial fibrillation may start suddenly and stop on its own, or it may become a long-lasting problem. What are the causes? This condition is caused by some heart-related conditions or procedures, including:  High blood pressure. This is the most common cause.  Heart failure.  Heart valve conditions.  Inflammation of the sac that surrounds the heart (pericarditis).  Heart surgery.  Coronary artery disease.  Certain heart rhythm disorders, such as Wolf-Parkinson-White syndrome. Other causes include:  Pneumonia.  Obstructive sleep apnea.  Lung cancer.  Thyroid problems, especially if the thyroid is overactive (hyperthyroidism).  Excessive alcohol or drug use. Sometimes, the cause of this condition is not known. What increases the risk? This condition is more likely to develop in:  Older people.  People who smoke.  People who have diabetes mellitus.  People who are overweight (obese).  Athletes who exercise vigorously.  People who have a family history. What are the signs or symptoms? Symptoms of this condition include:  A feeling that your heart is beating rapidly or irregularly.  A feeling of discomfort or pain in your  chest.  Shortness of breath.  Sudden light-headedness or weakness.  Getting tired easily during exercise. In some cases, there are no symptoms. How is this diagnosed? Your health care provider may be able to detect atrial fibrillation when taking your pulse. If detected, this condition may be diagnosed with:  Electrocardiogram (ECG).  Ambulatory cardiac monitor. This device records your heartbeats for 24 hours or more.  Transthoracic echocardiogram (TTE) to evaluate how blood flows through your  heart.  Transesophageal echocardiogram (TEE) to view more detailed images of your heart.  A stress test.  Imaging tests, such as a CT scan or chest X-ray.  Blood tests. How is this treated? This condition may be treated with:  Medicines to slow down the heart rate or bring the heart's rhythm back to normal.  Medicines to prevent blood clots from forming.  Electrical cardioversion. This delivers a low-energy shock to the heart to reset its rhythm.  Ablation. This procedure destroys the part of the heart tissue that sends abnormal signals.  Left atrial appendage occlusion/excision. This seals off a common place in the atria where blood clots can form (left atrial appendage). The goal of treatment is to prevent blood clots from forming and to keep your heart beating at a normal rate and rhythm. Treatment depends on underlying medical conditions and how you feel when you are experiencing fibrillation. Follow these instructions at home: Medicines  Take over-the counter and prescription medicines only as told by your health care provider.  If your health care provider prescribed a blood-thinning medicine (anticoagulant), take it exactly as told. Taking too much blood-thinning medicine can cause bleeding. Taking too little can enable a blood clot to form and travel to the brain, causing a stroke. Lifestyle      Do not use any products that contain nicotine or tobacco, such as cigarettes and e-cigarettes. If you need help quitting, ask your health care provider.  Do not drink beverages that contain caffeine, such as coffee, soda, and tea.  Follow diet instructions as told by your health care provider.  Exercise regularly as told by your health care provider.  Do not drink alcohol. General instructions  If you have obstructive sleep apnea, manage your condition as told by your health care provider.  Maintain a healthy weight. Do not use diet pills unless your health care provider  approves. Diet pills may make heart problems worse.  Keep all follow-up visits as told by your health care provider. This is important. Contact a health care provider if you:  Notice a change in the rate, rhythm, or strength of your heartbeat.  Are taking an anticoagulant and you notice increased bruising.  Tire more easily when you exercise or exert yourself.  Have a sudden change in weight. Get help right away if you have:   Chest pain, abdominal pain, sweating, or weakness.  Difficulty breathing.  Blood in your vomit, stool (feces), or urine.  Any symptoms of a stroke. "BE FAST" is an easy way to remember the main warning signs of a stroke: ? B - Balance. Signs are dizziness, sudden trouble walking, or loss of balance. ? E - Eyes. Signs are trouble seeing or a sudden change in vision. ? F - Face. Signs are sudden weakness or numbness of the face, or the face or eyelid drooping on one side. ? A - Arms. Signs are weakness or numbness in an arm. This happens suddenly and usually on one side of the body. ? S - Speech. Signs  are sudden trouble speaking, slurred speech, or trouble understanding what people say. ? T - Time. Time to call emergency services. Write down what time symptoms started.  Other signs of a stroke, such as: ? A sudden, severe headache with no known cause. ? Nausea or vomiting. ? Seizure. These symptoms may represent a serious problem that is an emergency. Do not wait to see if the symptoms will go away. Get medical help right away. Call your local emergency services (911 in the [place] Do not drive yourself to the hospital. Summary  Atrial fibrillation is a type of irregular or rapid heartbeat (arrhythmia).  Symptoms include a feeling that your heart is beating fast or irregularly. In some cases, you may not have symptoms.  The condition is treated with medicines to slow down the heart rate or bring the heart's rhythm back to normal. You may also need  blood-thinning medicines to prevent blood clots.  Get help right away if you have symptoms or signs of a stroke. This information is not intended to replace advice given to you by your health care provider. Make sure you discuss any questions you have with your health care provider. Document Released: 09/09/2005 Document Revised: 10/31/2017 Document Reviewed: 10/31/2017 Elsevier Interactive Patient Education  2019 [employer].  Stroke Prevention Some medical conditions and behaviors are associated with a higher chance of having a stroke. You can help prevent a stroke by making nutrition, lifestyle, and other changes, including managing any medical conditions you may have. What nutrition changes can be made?   Eat healthy foods. You can do this by: ? Choosing foods high in fiber, such as fresh fruits and vegetables and whole grains. ? Eating at least 5 or more servings of fruits and vegetables a day. Try to fill half of your plate at each meal with fruits and vegetables. ? Choosing lean protein foods, such as lean cuts of meat, poultry without skin, fish, tofu, beans, and nuts. ? Eating low-fat dairy products. ? Avoiding foods that are high in salt (sodium). This can help lower blood pressure. ? Avoiding foods that have saturated fat, trans fat, and cholesterol. This can help prevent high cholesterol. ? Avoiding processed and premade foods.  Follow your health care provider's specific guidelines for losing weight, controlling high blood pressure (hypertension), lowering high cholesterol, and managing diabetes. These may include: ? Reducing your daily calorie intake. ? Limiting your daily sodium intake to 1,500 milligrams (mg). ? Using only healthy fats for cooking, such as olive oil, canola oil, or sunflower oil. ? Counting your daily carbohydrate intake. What lifestyle changes can be made?  Maintain a healthy weight. Talk to your health care provider about your ideal weight.  Get at  least 30 minutes of moderate physical activity at least 5 days a week. Moderate activity includes brisk walking, biking, and swimming.  Do not use any products that contain nicotine or tobacco, such as cigarettes and e-cigarettes. If you need help quitting, ask your health care provider. It may also be helpful to avoid exposure to secondhand smoke.  Limit alcohol intake to no more than 1 drink a day for nonpregnant women and 2 drinks a day for men. One drink equals 12 oz of beer, 5 oz of wine, or 1 oz of hard liquor.  Stop any illegal drug use.  Avoid taking birth control pills. Talk to your health care provider about the risks of taking birth control pills if: ? You are over 16 years old. ? You  smoke. ? You get migraines. ? You have ever had a blood clot. What other changes can be made?  Manage your cholesterol levels. ? Eating a healthy diet is important for preventing high cholesterol. If cholesterol cannot be managed through diet alone, you may also need to take medicines. ? Take any prescribed medicines to control your cholesterol as told by your health care provider.  Manage your diabetes. ? Eating a healthy diet and exercising regularly are important parts of managing your blood sugar. If your blood sugar cannot be managed through diet and exercise, you may need to take medicines. ? Take any prescribed medicines to control your diabetes as told by your health care provider.  Control your hypertension. ? To reduce your risk of stroke, try to keep your blood pressure below 130/80. ? Eating a healthy diet and exercising regularly are an important part of controlling your blood pressure. If your blood pressure cannot be managed through diet and exercise, you may need to take medicines. ? Take any prescribed medicines to control hypertension as told by your health care provider. ? Ask your health care provider if you should monitor your blood pressure at home. ? Have your blood  pressure checked every year, even if your blood pressure is normal. Blood pressure increases with age and some medical conditions.  Get evaluated for sleep disorders (sleep apnea). Talk to your health care provider about getting a sleep evaluation if you snore a lot or have excessive sleepiness.  Take over-the-counter and prescription medicines only as told by your health care provider. Aspirin or blood thinners (antiplatelets or anticoagulants) may be recommended to reduce your risk of forming blood clots that can lead to stroke.  Make sure that any other medical conditions you have, such as atrial fibrillation or atherosclerosis, are managed. What are the warning signs of a stroke? The warning signs of a stroke can be easily remembered as BEFAST.  B is for balance. Signs include: ? Dizziness. ? Loss of balance or coordination. ? Sudden trouble walking.  E is for eyes. Signs include: ? A sudden change in vision. ? Trouble seeing.  F is for face. Signs include: ? Sudden weakness or numbness of the face. ? The face or eyelid drooping to one side.  A is for arms. Signs include: ? Sudden weakness or numbness of the arm, usually on one side of the body.  S is for speech. Signs include: ? Trouble speaking (aphasia). ? Trouble understanding.  T is for time. ? These symptoms may represent a serious problem that is an emergency. Do not wait to see if the symptoms will go away. Get medical help right away. Call your local emergency services (911 in the [place] Do not drive yourself to the hospital.  Other signs of stroke may include: ? A sudden, severe headache with no known cause. ? Nausea or vomiting. ? Seizure. Where to find more information For more information, visit:  American Stroke Association: www.strokeassociation.org  National Stroke Association: www.stroke.org Summary  You can prevent a stroke by eating healthy, exercising, not smoking, limiting alcohol intake, and  managing any medical conditions you may have.  Do not use any products that contain nicotine or tobacco, such as cigarettes and e-cigarettes. If you need help quitting, ask your health care provider. It may also be helpful to avoid exposure to secondhand smoke.  Remember BEFAST for warning signs of stroke. Get help right away if you or a loved one has any of these signs.  This information is not intended to replace advice given to you by your health care provider. Make sure you discuss any questions you have with your health care provider. Document Released: 10/17/2004 Document Revised: 10/15/2016 Document Reviewed: 10/15/2016 Elsevier Interactive Patient Education  2019 [employer].

## 2018-12-14 ENCOUNTER — Encounter: Payer: Self-pay | Admitting: Neurology

## 2018-12-14 DIAGNOSIS — G459 Transient cerebral ischemic attack, unspecified: Secondary | ICD-10-CM | POA: Insufficient documentation

## 2018-12-15 ENCOUNTER — Telehealth: Payer: Self-pay | Admitting: Neurology

## 2018-12-15 NOTE — Telephone Encounter (Signed)
Dr. Jaynee Eagles, would you consider this patient's EEG order to be urgent, or can it be pushed out a while?

## 2018-12-15 NOTE — Telephone Encounter (Signed)
[employer] pending faxed clinical notes

## 2018-12-15 NOTE — Telephone Encounter (Signed)
It can definitely be pushed out thank you!

## 2018-12-16 NOTE — Telephone Encounter (Signed)
[employer] medicare Josem Kaufmann: [id number] (exp. 12/15/18 to 06/13/19) order sent to [facility]. They will reach out to the pt to schedule.

## 2018-12-21 ENCOUNTER — Telehealth: Payer: Self-pay | Admitting: Cardiovascular Disease

## 2018-12-21 NOTE — Telephone Encounter (Signed)
New message     Called pt to verify address and ins info in order to have event monitor sent to his home.  Patient stated that he did not want to wear the monitor.  He will have his MRI's but refused to wear the monitor.  Staff msg sent to [facility].

## 2018-12-21 NOTE — Telephone Encounter (Signed)
Routed to Dr. Acie Fredrickson for his awareness

## 2018-12-22 ENCOUNTER — Telehealth: Payer: Self-pay | Admitting: Radiology

## 2018-12-22 NOTE — Telephone Encounter (Signed)
I talked to [facility] and explained the need for the monitor. He agrees to wear the Zio patch monitor - lets monitor for 4 weeks Please mail the monitor to his home address  As a note:   There is a spelling correction on his address The name of his street is   "[place]"   ( the "s" was left off the word [place])   He is interested in getting his brain MRI as soon as possible.

## 2018-12-22 NOTE — Telephone Encounter (Signed)
Monitor has been placed in the mail by our monitor technician

## 2018-12-22 NOTE — Telephone Encounter (Signed)
Event monitor was enrolled to be mailed to patient on 3/31 due to Covid-19. Patient knows to expect a call from [employer] and she will receive the monitor in 5-7 days

## 2019-01-04 ENCOUNTER — Other Ambulatory Visit: Payer: Medicare HMO

## 2019-01-04 ENCOUNTER — Encounter (INDEPENDENT_AMBULATORY_CARE_PROVIDER_SITE_OTHER): Payer: Medicare HMO

## 2019-01-04 DIAGNOSIS — R4701 Aphasia: Secondary | ICD-10-CM | POA: Diagnosis not present

## 2019-01-04 DIAGNOSIS — F4489 Other dissociative and conversion disorders: Secondary | ICD-10-CM | POA: Diagnosis not present

## 2019-01-04 DIAGNOSIS — I4891 Unspecified atrial fibrillation: Secondary | ICD-10-CM

## 2019-01-04 DIAGNOSIS — G459 Transient cerebral ischemic attack, unspecified: Secondary | ICD-10-CM

## 2019-01-05 ENCOUNTER — Other Ambulatory Visit: Payer: Self-pay

## 2019-01-05 ENCOUNTER — Ambulatory Visit
Admission: RE | Admit: 2019-01-05 | Discharge: 2019-01-05 | Disposition: A | Discharge status: Home / Self Care | Payer: Medicare HMO | Source: Ambulatory Visit | Attending: Neurology | Admitting: Neurology

## 2019-01-05 DIAGNOSIS — G459 Transient cerebral ischemic attack, unspecified: Secondary | ICD-10-CM

## 2019-01-05 DIAGNOSIS — R4701 Aphasia: Secondary | ICD-10-CM

## 2019-01-05 DIAGNOSIS — F4489 Other dissociative and conversion disorders: Secondary | ICD-10-CM

## 2019-01-05 DIAGNOSIS — Z8489 Family history of other specified conditions: Secondary | ICD-10-CM

## 2019-01-05 MED ORDER — GADOBENATE DIMEGLUMINE 529 MG/ML IV SOLN
18.0000 mL | Freq: Once | INTRAVENOUS | Status: AC | PRN
  Administered 2019-01-05: 18 mL via INTRAVENOUS

## 2019-01-07 ENCOUNTER — Telehealth: Payer: Self-pay | Admitting: *Deleted

## 2019-01-07 NOTE — Telephone Encounter (Signed)
-----   Message from Melvenia Beam, MD sent at 01/07/2019 11:33 AM EDT ----- MRI of the brain and MRA of the head normal for age.

## 2019-01-07 NOTE — Telephone Encounter (Signed)
Spoke with patient and reviewed MRI brain, MRA head results. He verbalized understanding and appreciation. He wasn't sure he needed the EEG since the MRI/MRA were normal. I did review the rationalization for having the EEG and the differences in the tests and what we're looking for. However he said with the episode happening in 3 years "what makes her think we're going to catch it in 3 days" and wanted this question posed before proceeding. He also wasn't sure he wanted the extended EEG done anyway. Pt verbalized appreciation for the call and would like a call back.

## 2019-01-13 ENCOUNTER — Other Ambulatory Visit: Payer: Medicare HMO

## 2019-02-02 ENCOUNTER — Other Ambulatory Visit: Payer: Self-pay

## 2019-02-02 ENCOUNTER — Other Ambulatory Visit: Payer: Self-pay | Admitting: Neurology

## 2019-02-02 DIAGNOSIS — G459 Transient cerebral ischemic attack, unspecified: Secondary | ICD-10-CM

## 2019-02-02 DIAGNOSIS — I4891 Unspecified atrial fibrillation: Secondary | ICD-10-CM

## 2019-02-02 DIAGNOSIS — F4489 Other dissociative and conversion disorders: Secondary | ICD-10-CM

## 2019-02-02 DIAGNOSIS — R4701 Aphasia: Secondary | ICD-10-CM

## 2019-02-08 ENCOUNTER — Telehealth: Payer: Self-pay | Admitting: *Deleted

## 2019-02-08 NOTE — Telephone Encounter (Signed)
Spoke with pt and informed him of normal sinus rhythm shown on cardiac event monitor, no A-fib. He verbalized understanding and appreciation. Pt did not complete EEG on 4/22. His questions were answered. Pt willing to complete this. I advised I would send a message to staff to call him back and schedule. He verbalized appreciation.

## 2019-02-08 NOTE — Telephone Encounter (Signed)
Called patient and scheduled his EEG for 6/4 at 10:45.

## 2019-02-08 NOTE — Telephone Encounter (Signed)
-----   Message from Melvenia Beam, MD sent at 02/07/2019  4:51 PM EDT ----- Normal sinus rhythm

## 2019-02-19 ENCOUNTER — Telehealth: Payer: Self-pay | Admitting: Nurse Practitioner

## 2019-02-19 NOTE — Telephone Encounter (Signed)
Spoke with patient regarding his upcoming appointment with Dr. Acie Fredrickson on June 9. He would like to postpone to a day when Dr. Acie Fredrickson will be in the office. Patient also needs lab work since he was started on rosuvastatin approximately 3 months ago. Lab appointment scheduled for 7/2 and appointment with Dr. Acie Fredrickson scheduled for 7/14. Patient thanked me for the call.

## 2019-02-25 ENCOUNTER — Other Ambulatory Visit: Payer: Medicare HMO

## 2019-03-02 ENCOUNTER — Ambulatory Visit: Payer: Medicare HMO | Admitting: Cardiovascular Disease

## 2019-03-25 ENCOUNTER — Other Ambulatory Visit: Payer: Medicare HMO

## 2019-04-03 IMAGING — CT CT HEAD CODE STROKE
3 series · 15 of 47 positions shown, 18 images · non-contrast
Comparison: Brain MRI 01/17/2005

CLINICAL DATA: Code stroke. 66-year-old male with altered mental
status, confusion and blurred vision. Initial encounter.

EXAM:
CT HEAD WITHOUT CONTRAST
TECHNIQUE: Contiguous axial images were obtained from the base of the skull
through the vertex without intravenous contrast.

[Series 3: head 5.0 st · axial · 0.49mm/px · z∈[-178,-18]mm · 9 of 38 slices shown, 12 images]
[im 3/38  brain]
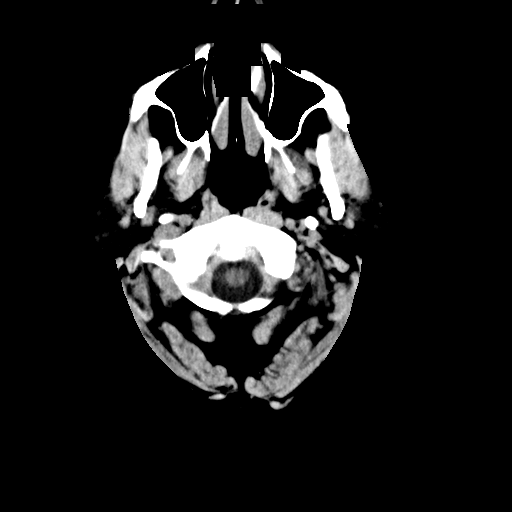
[im 3/38  bone]
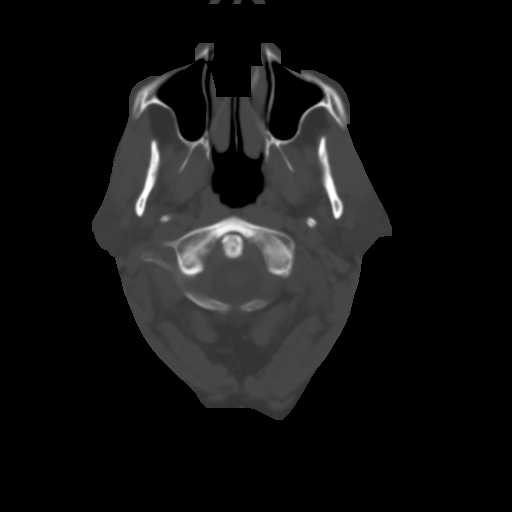
[im 7/38  brain]
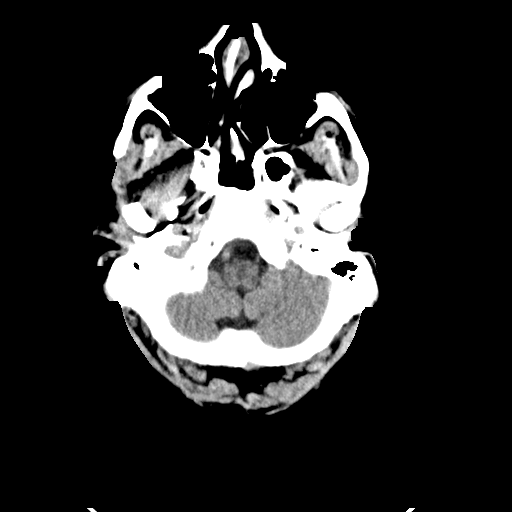
[im 11/38  brain]
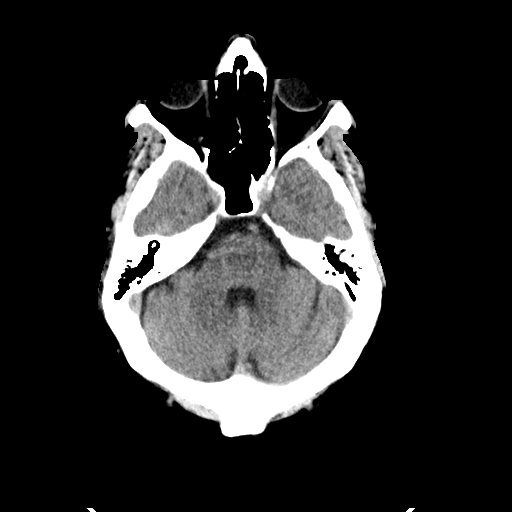
[im 15/38  brain]
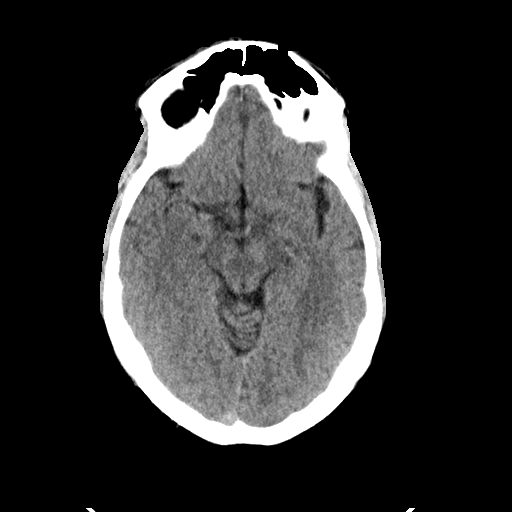
[im 20/38  brain]
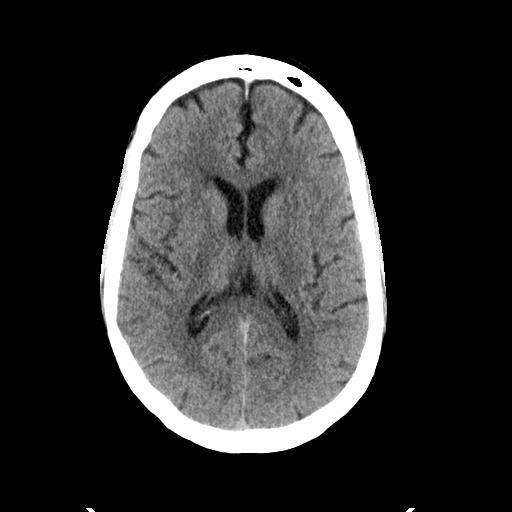
[im 20/38  bone]
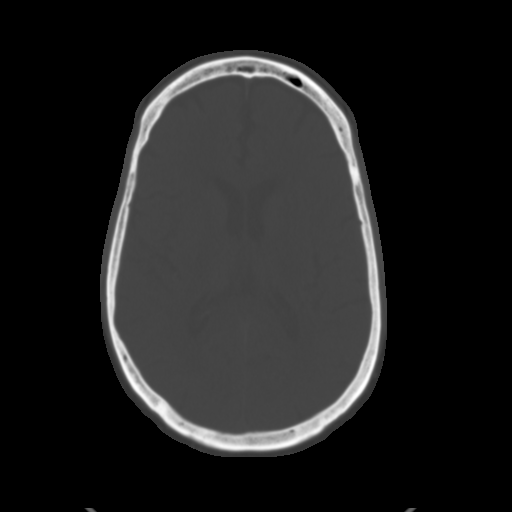
[im 23/38  brain]
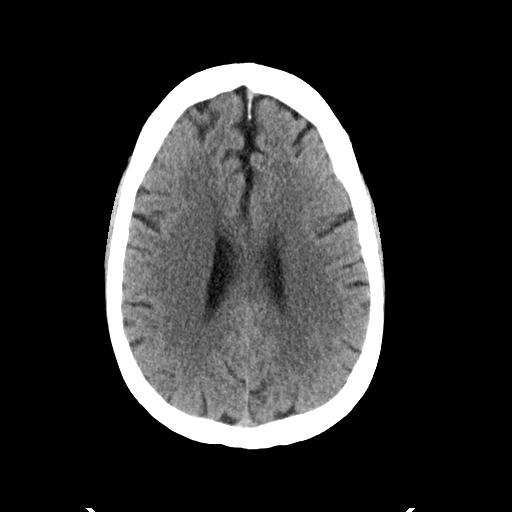
[im 27/38  brain]
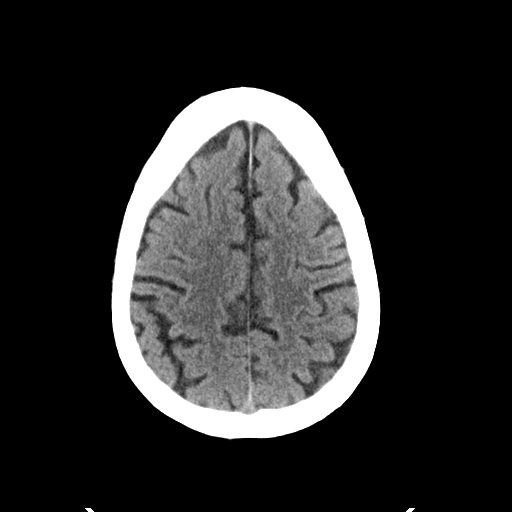
[im 31/38  brain]
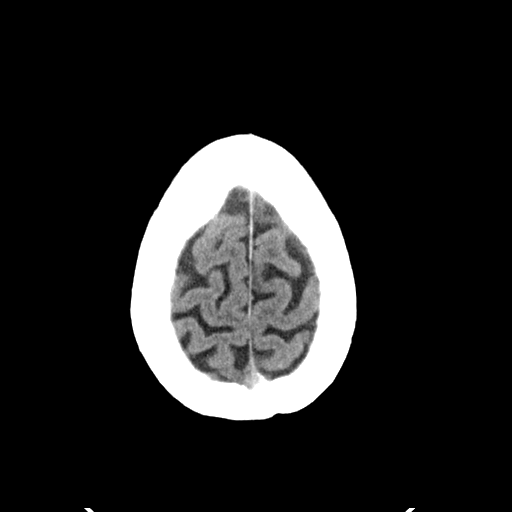
[im 35/38  brain]
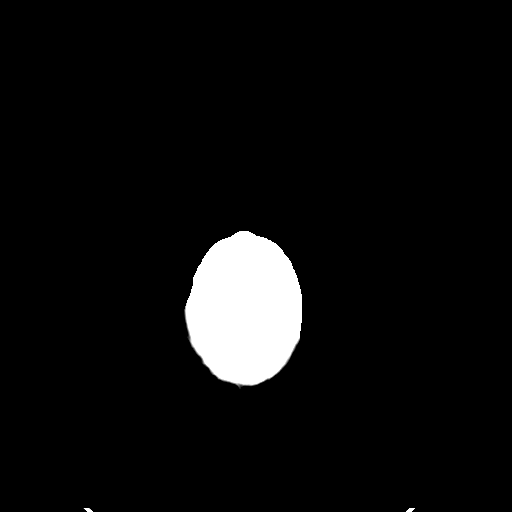
[im 35/38  bone]
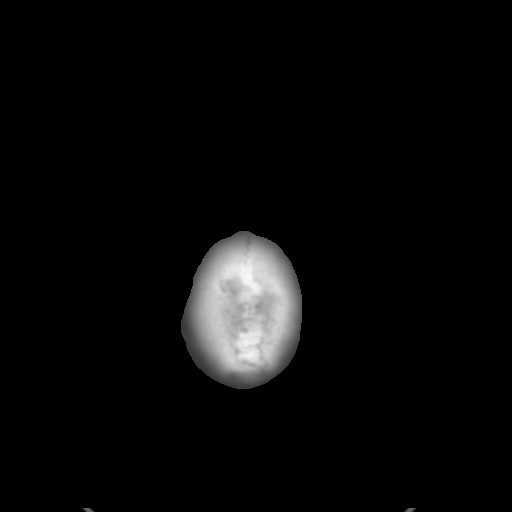

[Series 5: head 3.0 cor st · coronal · 0.45mm/px · 3 of 77 slices shown]
[im 26/77  brain]
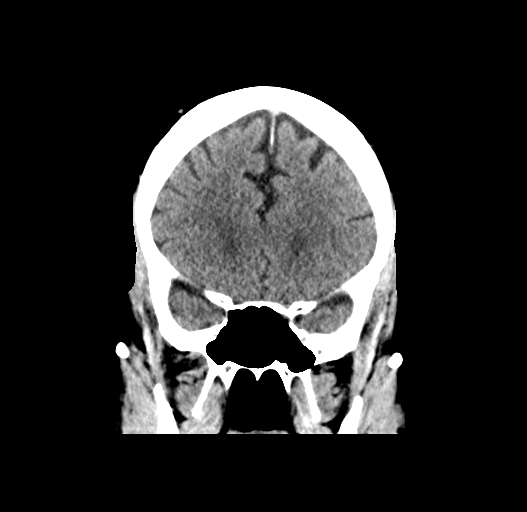
[im 34/77  brain]
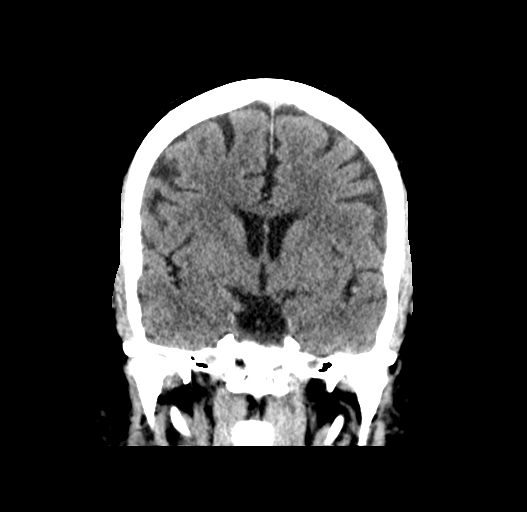
[im 43/77  brain]
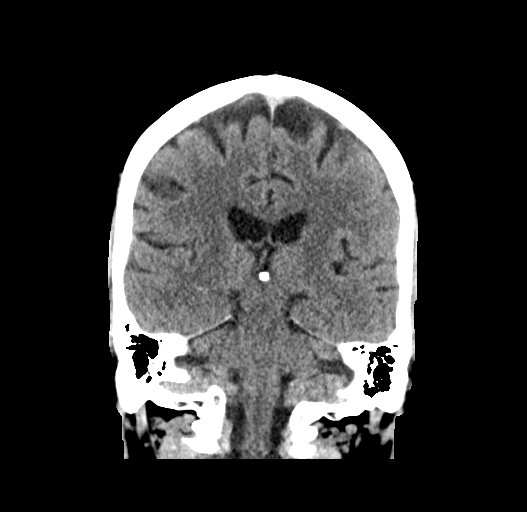

[Series 6: head 3.0 sag st · sagittal · 0.38mm/px · 3 of 67 slices shown]
[im 23/67  brain]
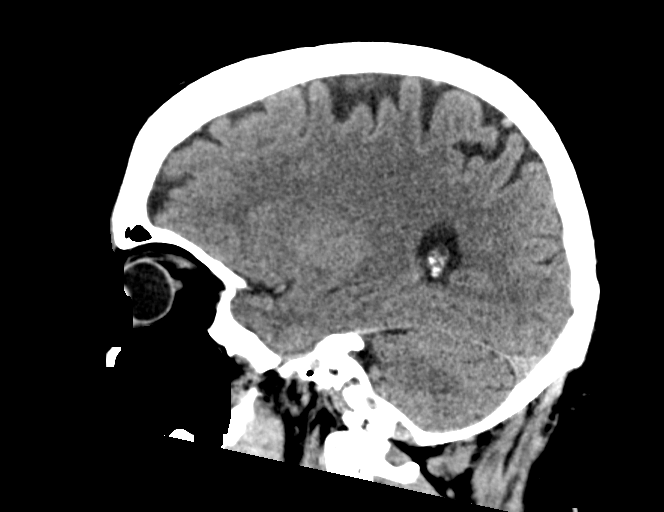
[im 34/67  brain]
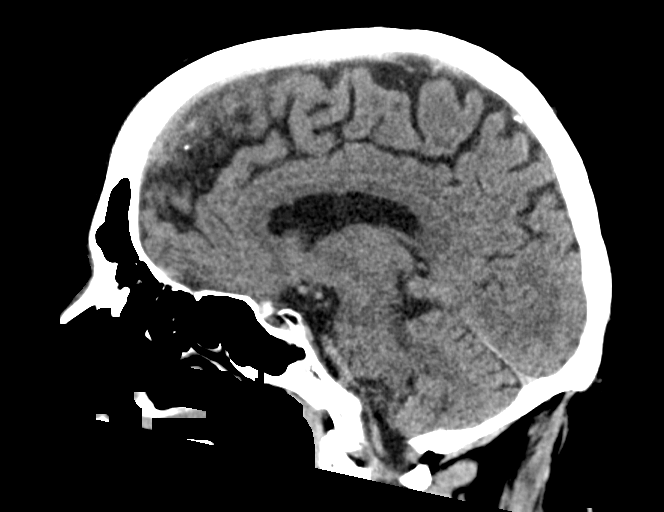
[im 45/67  brain]
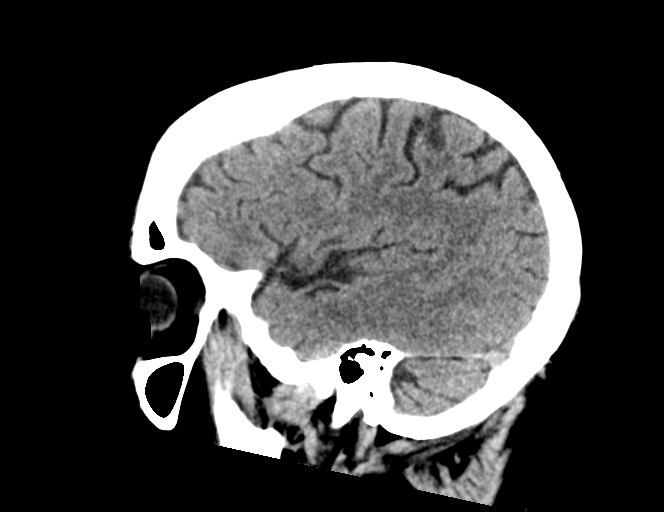

[15 of 47 positions shown; findings below may reference images not displayed]

FINDINGS: Brain: Cerebral volume remains normal for age. No midline shift,
ventriculomegaly, mass effect, evidence of mass lesion, intracranial
hemorrhage or evidence of cortically based acute infarction.
Gray-white matter differentiation is within normal limits throughout
the brain. No cortical encephalomalacia.

Vascular: Calcified atherosclerosis at the skull base. No suspicious
intracranial vascular hyperdensity.

Skull: No acute osseous abnormality identified.

Sinuses/Orbits: Visualized paranasal sinuses and mastoids are stable
and well pneumatized.

Other: Visualized scalp soft tissues are within normal limits.
Visualized orbit soft tissues are within normal limits.

ASPECTS (Alberta Stroke Program Early CT Score)

- Ganglionic level infarction (caudate, lentiform nuclei, internal
capsule, insula, M1-M3 cortex): 7

- Supraganglionic infarction (M4-M6 cortex): 3

Total score (0-10 with 10 being normal): 10
IMPRESSION: 1. Normal for age non contrast CT appearance of the brain.
2. ASPECTS is 10.
3. The above was relayed via text pager to Bybeto Akemi on 01/27/2017 at

## 2019-04-05 ENCOUNTER — Telehealth: Payer: Self-pay | Admitting: Cardiovascular Disease

## 2019-04-05 NOTE — Telephone Encounter (Signed)
New Message ° ° ° °Left message to confirm appt and answer covid questions  °

## 2019-04-05 NOTE — Telephone Encounter (Signed)

## 2019-04-06 ENCOUNTER — Encounter: Payer: Self-pay | Admitting: Cardiovascular Disease

## 2019-04-06 ENCOUNTER — Other Ambulatory Visit: Payer: Self-pay

## 2019-04-06 ENCOUNTER — Encounter (INDEPENDENT_AMBULATORY_CARE_PROVIDER_SITE_OTHER): Payer: Self-pay

## 2019-04-06 ENCOUNTER — Ambulatory Visit: Payer: Medicare HMO | Admitting: Cardiovascular Disease

## 2019-04-06 VITALS — BP 130/86 | HR 53 | Ht 73.0 in | Wt 199.0 lb

## 2019-04-06 DIAGNOSIS — S93402A Sprain of unspecified ligament of left ankle, initial encounter: Secondary | ICD-10-CM | POA: Insufficient documentation

## 2019-04-06 DIAGNOSIS — G459 Transient cerebral ischemic attack, unspecified: Secondary | ICD-10-CM

## 2019-04-06 NOTE — Progress Notes (Signed)
Cardiology Office Note:    Date:  04/06/2019   ID:  [name], DOB 1951/01/11, MRN [id number]  PCP:  Levin Erp, MD  Cardiologist:  Mertie Moores, MD  Electrophysiologist:  None   Referring MD: Levin Erp, MD   Chief Complaint  Patient presents with  . Transient Ischemic Attack     December 07, 2018    Mike Arias is a 68 y.o. male with a recent  hx of a TIA. He is self referred ( friend and neighbor of Bea Laura, [place])   Mike Arias had an episode of inability to speak correctly  1 week ago The symptoms lasted 15 minutes - 30 min  and have not returned No weakness or numbness A few min of blurry vision No Cp , no dyspnea.    Lipids are elevated.   Checks his BP at the [facility] ,  Is occasionally elevated  Does not eat much salt  On no meds   Swam his 1.25 miles the next day without any issues   CT of the head was negative.  Carotid duplex revealed mild plaque.  [place], , heating oil and propane .     Went to [employer].  Swam  stuideied busienss   April 06, 2019   Mike Arias is seen today for follow up of his TIA  Monitor showed NSR Echo shows nomal LV function.  Mild Ao dilitation      Past Medical History:  Diagnosis Date  . GERD (gastroesophageal reflux disease)   . HLD (hyperlipidemia)   . TIA (transient ischemic attack)    ? TIA    Past Surgical History:  Procedure Laterality Date  . CERVICAL SPINE SURGERY    . COLONOSCOPY    . ESOPHAGOGASTRODUODENOSCOPY ENDOSCOPY    . TONSILLECTOMY      Current Medications: Current Meds  Medication Sig  . aspirin 325 MG tablet Take 325 mg by mouth daily.  . rosuvastatin (CRESTOR) 10 MG tablet Take 1 tablet (10 mg total) by mouth daily.   Current Facility-Administered Medications for the 04/06/19 encounter (Office Visit) with Giomar Gusler, Wonda Cheng, MD  Medication  . 0.9 %  sodium chloride infusion     Allergies:   Patient has no known allergies.   Social History   Socioeconomic History  . Marital status:  Married    Spouse name: Not on file  . Number of children: Not on file  . Years of education: Not on file  . Highest education level: Not on file  Occupational History  . Not on file  Social Needs  . Financial resource strain: Not on file  . Food insecurity    Worry: Not on file    Inability: Not on file  . Transportation needs    Medical: Not on file    Non-medical: Not on file  Tobacco Use  . Smoking status: Never Smoker  . Smokeless tobacco: Never Used  . Tobacco comment: maybe a cigar on occasion  Substance and Sexual Activity  . Alcohol use: Yes    Comment: 4 days a week  . Drug use: No  . Sexual activity: Not on file  Lifestyle  . Physical activity    Days per week: Not on file    Minutes per session: Not on file  . Stress: Not on file  Relationships  . Social Herbalist on phone: Not on file    Gets together: Not on file    Attends  religious service: Not on file    Active member of club or organization: Not on file    Attends meetings of clubs or organizations: Not on file    Relationship status: Not on file  Other Topics Concern  . Not on file  Social History Narrative  . Not on file     Family History: The patient's family history includes Lung cancer in his father. There is no history of Colon cancer.  ROS:   Please see the history of present illness.     All other systems reviewed and are negative.  EKGs/Labs/Other Studies Reviewed:    The following studies were reviewed today:   EKG: December 07, 2018: Sinus bradycardia 51 beats minute.  Otherwise normal EKG.  Recent Labs: No results found for requested labs within last 8760 hours.  Recent Lipid Panel No results found for: CHOL, TRIG, HDL, CHOLHDL, VLDL, LDLCALC, LDLDIRECT  Physical Exam: Blood pressure 130/86, pulse (!) 53, height 6\' 1"  (1.854 m), weight 199 lb (90.3 kg), SpO2 97 %.  GEN:  Well nourished, well developed in no acute distress HEENT: Normal NECK: No JVD; No carotid  bruits LYMPHATICS: No lymphadenopathy CARDIAC: RRR RESPIRATORY:  Clear to auscultation without rales, wheezing or rhonchi  ABDOMEN: Soft, non-tender, non-distended MUSCULOSKELETAL:  No edema; No deformity  SKIN: Warm and dry NEUROLOGIC:  Alert and oriented x 3    ASSESSMENT:    No diagnosis found. PLAN:       TIA - Mike Arias  had a TIA several months ago .   Echo is unremarkable except for mild dilitation of his ascending ao. Even monitor looks good  I will see him on an as needed basis.    2. Hyperlipidemia :  Followed byhis primary MD   Medication Adjustments/Labs and Tests Ordered: Current medicines are reviewed at length with the patient today.  Concerns regarding medicines are outlined above.  No orders of the defined types were placed in this encounter.  No orders of the defined types were placed in this encounter.   Patient Instructions  Medication Instructions:  Your physician recommends that you continue on your current medications as directed. Please refer to the Current Medication list given to you today.  If you need a refill on your cardiac medications before your next appointment, please call your pharmacy.    Lab work: None Ordered    Testing/Procedures: None Ordered   Follow-Up: At [facility], you and your health needs are our priority.  As part of our continuing mission to provide you with exceptional heart care, we have created designated Provider Care Teams.  These Care Teams include your primary Cardiologist (physician) and Advanced Practice Providers (APPs -  Physician Assistants and Nurse Practitioners) who all work together to provide you with the care you need, when you need it. You will need a follow up appointment in:  As Needed.  Please call our office 2 months in advance to schedule this appointment.  You may see Mertie Moores, MD or one of the following Advanced Practice Providers on your designated Care Team: Richardson Dopp, PA-C [place], [place] . Daune Perch, NP       Signed, Mertie Moores, MD  04/06/2019 6:22 PM    [place]

## 2019-04-06 NOTE — Patient Instructions (Signed)
Medication Instructions:  Your physician recommends that you continue on your current medications as directed. Please refer to the Current Medication list given to you today.  If you need a refill on your cardiac medications before your next appointment, please call your pharmacy.    Lab work: None Ordered    Testing/Procedures: None Ordered   Follow-Up: At [facility], you and your health needs are our priority.  As part of our continuing mission to provide you with exceptional heart care, we have created designated Provider Care Teams.  These Care Teams include your primary Cardiologist (physician) and Advanced Practice Providers (APPs -  Physician Assistants and Nurse Practitioners) who all work together to provide you with the care you need, when you need it. You will need a follow up appointment in:  As Needed.  Please call our office 2 months in advance to schedule this appointment.  You may see Mertie Moores, MD or one of the following Advanced Practice Providers on your designated Care Team: Richardson Dopp, PA-C [place], [place] . Daune Perch, NP

## 2019-09-25 DIAGNOSIS — U071 COVID-19: Secondary | ICD-10-CM | POA: Diagnosis not present

## 2019-09-30 DIAGNOSIS — R350 Frequency of micturition: Secondary | ICD-10-CM | POA: Insufficient documentation

## 2019-09-30 DIAGNOSIS — Z1331 Encounter for screening for depression: Secondary | ICD-10-CM | POA: Diagnosis not present

## 2019-09-30 DIAGNOSIS — E7849 Other hyperlipidemia: Secondary | ICD-10-CM | POA: Diagnosis not present

## 2019-09-30 DIAGNOSIS — Z8619 Personal history of other infectious and parasitic diseases: Secondary | ICD-10-CM | POA: Insufficient documentation

## 2019-09-30 DIAGNOSIS — B001 Herpesviral vesicular dermatitis: Secondary | ICD-10-CM | POA: Insufficient documentation

## 2019-09-30 DIAGNOSIS — E785 Hyperlipidemia, unspecified: Secondary | ICD-10-CM | POA: Insufficient documentation

## 2019-09-30 DIAGNOSIS — Z8673 Personal history of transient ischemic attack (TIA), and cerebral infarction without residual deficits: Secondary | ICD-10-CM | POA: Insufficient documentation

## 2019-10-04 DIAGNOSIS — Z20828 Contact with and (suspected) exposure to other viral communicable diseases: Secondary | ICD-10-CM | POA: Diagnosis not present

## 2019-11-15 DIAGNOSIS — R82998 Other abnormal findings in urine: Secondary | ICD-10-CM | POA: Diagnosis not present

## 2019-11-16 DIAGNOSIS — Z0000 Encounter for general adult medical examination without abnormal findings: Secondary | ICD-10-CM | POA: Diagnosis not present

## 2019-11-16 DIAGNOSIS — E7849 Other hyperlipidemia: Secondary | ICD-10-CM | POA: Diagnosis not present

## 2019-11-16 DIAGNOSIS — Z125 Encounter for screening for malignant neoplasm of prostate: Secondary | ICD-10-CM | POA: Diagnosis not present

## 2019-12-03 DIAGNOSIS — N400 Enlarged prostate without lower urinary tract symptoms: Secondary | ICD-10-CM | POA: Diagnosis not present

## 2019-12-07 DIAGNOSIS — M170 Bilateral primary osteoarthritis of knee: Secondary | ICD-10-CM | POA: Diagnosis not present

## 2019-12-13 DIAGNOSIS — R001 Bradycardia, unspecified: Secondary | ICD-10-CM | POA: Diagnosis not present

## 2019-12-13 DIAGNOSIS — E663 Overweight: Secondary | ICD-10-CM | POA: Insufficient documentation

## 2019-12-13 DIAGNOSIS — Z0000 Encounter for general adult medical examination without abnormal findings: Secondary | ICD-10-CM | POA: Diagnosis not present

## 2019-12-13 DIAGNOSIS — K649 Unspecified hemorrhoids: Secondary | ICD-10-CM | POA: Insufficient documentation

## 2019-12-13 DIAGNOSIS — Z1331 Encounter for screening for depression: Secondary | ICD-10-CM | POA: Diagnosis not present

## 2019-12-13 DIAGNOSIS — R718 Other abnormality of red blood cells: Secondary | ICD-10-CM | POA: Insufficient documentation

## 2019-12-13 DIAGNOSIS — E7849 Other hyperlipidemia: Secondary | ICD-10-CM | POA: Diagnosis not present

## 2019-12-13 DIAGNOSIS — R7989 Other specified abnormal findings of blood chemistry: Secondary | ICD-10-CM | POA: Insufficient documentation

## 2019-12-13 DIAGNOSIS — L609 Nail disorder, unspecified: Secondary | ICD-10-CM | POA: Insufficient documentation

## 2020-01-17 DIAGNOSIS — H43811 Vitreous degeneration, right eye: Secondary | ICD-10-CM | POA: Diagnosis not present

## 2020-01-17 DIAGNOSIS — D3131 Benign neoplasm of right choroid: Secondary | ICD-10-CM | POA: Diagnosis not present

## 2020-01-17 DIAGNOSIS — H43822 Vitreomacular adhesion, left eye: Secondary | ICD-10-CM | POA: Diagnosis not present

## 2020-01-17 DIAGNOSIS — H31091 Other chorioretinal scars, right eye: Secondary | ICD-10-CM | POA: Diagnosis not present

## 2020-01-17 DIAGNOSIS — H35371 Puckering of macula, right eye: Secondary | ICD-10-CM | POA: Diagnosis not present

## 2020-01-17 DIAGNOSIS — H33311 Horseshoe tear of retina without detachment, right eye: Secondary | ICD-10-CM | POA: Diagnosis not present

## 2020-01-24 DIAGNOSIS — H35371 Puckering of macula, right eye: Secondary | ICD-10-CM | POA: Diagnosis not present

## 2020-01-24 DIAGNOSIS — H43391 Other vitreous opacities, right eye: Secondary | ICD-10-CM | POA: Diagnosis not present

## 2020-01-24 DIAGNOSIS — H43811 Vitreous degeneration, right eye: Secondary | ICD-10-CM | POA: Diagnosis not present

## 2020-01-24 DIAGNOSIS — H43822 Vitreomacular adhesion, left eye: Secondary | ICD-10-CM | POA: Diagnosis not present

## 2020-01-24 DIAGNOSIS — H31091 Other chorioretinal scars, right eye: Secondary | ICD-10-CM | POA: Diagnosis not present

## 2020-01-25 ENCOUNTER — Encounter: Payer: Self-pay | Admitting: Gastroenterology

## 2020-02-08 DIAGNOSIS — H43811 Vitreous degeneration, right eye: Secondary | ICD-10-CM | POA: Diagnosis not present

## 2020-02-08 DIAGNOSIS — H43391 Other vitreous opacities, right eye: Secondary | ICD-10-CM | POA: Diagnosis not present

## 2020-02-08 DIAGNOSIS — H35371 Puckering of macula, right eye: Secondary | ICD-10-CM | POA: Diagnosis not present

## 2020-02-08 DIAGNOSIS — H31091 Other chorioretinal scars, right eye: Secondary | ICD-10-CM | POA: Diagnosis not present

## 2020-02-25 ENCOUNTER — Encounter: Payer: Self-pay | Admitting: Gastroenterology

## 2020-03-01 DIAGNOSIS — L720 Epidermal cyst: Secondary | ICD-10-CM | POA: Diagnosis not present

## 2020-03-01 DIAGNOSIS — L821 Other seborrheic keratosis: Secondary | ICD-10-CM | POA: Diagnosis not present

## 2020-03-01 DIAGNOSIS — M71341 Other bursal cyst, right hand: Secondary | ICD-10-CM | POA: Diagnosis not present

## 2020-03-01 DIAGNOSIS — D220 Melanocytic nevi of lip: Secondary | ICD-10-CM | POA: Diagnosis not present

## 2020-03-07 DIAGNOSIS — M6740 Ganglion, unspecified site: Secondary | ICD-10-CM | POA: Diagnosis not present

## 2020-03-07 DIAGNOSIS — L608 Other nail disorders: Secondary | ICD-10-CM | POA: Diagnosis not present

## 2020-03-07 DIAGNOSIS — M67441 Ganglion, right hand: Secondary | ICD-10-CM | POA: Diagnosis not present

## 2020-03-08 ENCOUNTER — Encounter: Payer: Medicare HMO | Admitting: Gastroenterology

## 2020-03-08 ENCOUNTER — Encounter: Payer: Self-pay | Admitting: Gastroenterology

## 2020-03-16 ENCOUNTER — Ambulatory Visit (AMBULATORY_SURGERY_CENTER): Payer: Self-pay | Admitting: *Deleted

## 2020-03-16 ENCOUNTER — Other Ambulatory Visit: Payer: Self-pay

## 2020-03-16 VITALS — Ht 73.0 in | Wt 190.0 lb

## 2020-03-16 DIAGNOSIS — Z86010 Personal history of colonic polyps: Secondary | ICD-10-CM

## 2020-03-16 NOTE — Progress Notes (Signed)

## 2020-03-23 ENCOUNTER — Encounter: Payer: Self-pay | Admitting: Gastroenterology

## 2020-03-23 ENCOUNTER — Other Ambulatory Visit: Payer: Self-pay

## 2020-03-23 ENCOUNTER — Ambulatory Visit (AMBULATORY_SURGERY_CENTER): Payer: Managed Care, Other (non HMO) | Admitting: Gastroenterology

## 2020-03-23 VITALS — BP 107/71 | HR 50 | Temp 96.6°F | Resp 13 | Ht 73.0 in | Wt 190.0 lb

## 2020-03-23 DIAGNOSIS — D124 Benign neoplasm of descending colon: Secondary | ICD-10-CM

## 2020-03-23 DIAGNOSIS — Z86010 Personal history of colonic polyps: Secondary | ICD-10-CM

## 2020-03-23 DIAGNOSIS — D120 Benign neoplasm of cecum: Secondary | ICD-10-CM

## 2020-03-23 MED ORDER — SODIUM CHLORIDE 0.9 % IV SOLN: 500.0000 mL | Freq: Once | INTRAVENOUS | Status: DC

## 2020-03-23 NOTE — Progress Notes (Signed)
Pt. Reports no change in his medical or surgical history since his pre-visit 03/16/2020.

## 2020-03-23 NOTE — Progress Notes (Signed)
Called to room to assist during endoscopic procedure.  Patient ID and intended procedure confirmed with present staff. Received instructions for my participation in the procedure from the performing physician.  

## 2020-03-23 NOTE — Op Note (Addendum)
[facility] Patient Name: Mike Arias Procedure Date: 03/23/2020 9:58 AM MRN: [id number] Endoscopist: Mallie Mussel L. Loletha Carrow , MD Age: 69 Referring MD:  Date of Birth: October 06, 1950 Gender: Male Account #: [id number] Procedure:                Colonoscopy Indications:              Surveillance: Personal history of adenomatous                            polyps on last colonoscopy 3 years ago (TA x 5 ;                            02/2017) Medicines:                Monitored Anesthesia Care Procedure:                Pre-Anesthesia Assessment:                           - Prior to the procedure, a History and Physical                            was performed, and patient medications and                            allergies were reviewed. The patient's tolerance of                            previous anesthesia was also reviewed. The risks                            and benefits of the procedure and the sedation                            options and risks were discussed with the patient.                            All questions were answered, and informed consent                            was obtained. Prior Anticoagulants: The patient has                            taken no previous anticoagulant or antiplatelet                            agents. ASA Grade Assessment: II - A patient with                            mild systemic disease. After reviewing the risks                            and benefits, the patient was deemed in  satisfactory condition to undergo the procedure.                           After obtaining informed consent, the colonoscope                            was passed under direct vision. Throughout the                            procedure, the patient's blood pressure, pulse, and                            oxygen saturations were monitored continuously. The                            Colonoscope was introduced through the anus and                             advanced to the the cecum, identified by                            appendiceal orifice and ileocecal valve. The                            colonoscopy was performed without difficulty. The                            patient tolerated the procedure well. The quality                            of the bowel preparation was excellent. The                            ileocecal valve, appendiceal orifice, and rectum                            were photographed. The bowel preparation used was                            Miralax. Scope In: 10:05:36 AM Scope Out: 10:22:50 AM Scope Withdrawal Time: 0 hours 14 minutes 20 seconds  Total Procedure Duration: 0 hours 17 minutes 14 seconds  Findings:                 The perianal and digital rectal examinations were                            normal.                           A 12 mm polyp was found in the cecum. The polyp was                            sessile. The polyp was removed with a piecemeal  technique using a cold snare. Resection and                            retrieval were complete.                           Two sessile polyps were found in the descending                            colon. The polyps were 3 to 5 mm in size. These                            polyps were removed with a cold snare. Resection                            and retrieval were complete.                           The exam was otherwise without abnormality on                            direct and retroflexion views. Complications:            No immediate complications. Estimated Blood Loss:     Estimated blood loss was minimal. Impression:               - One 12 mm polyp in the cecum, removed piecemeal                            using a cold snare. Resected and retrieved.                           - Two 3 to 5 mm polyps in the descending colon,                            removed with a cold snare. Resected and retrieved.                            - The examination was otherwise normal on direct                            and retroflexion views. Recommendation:           - Patient has a contact number available for                            emergencies. The signs and symptoms of potential                            delayed complications were discussed with the                            patient. Return to normal activities tomorrow.  Written discharge instructions were provided to the                            patient.                           - Resume previous diet.                           - Continue present medications.                           - Await pathology results.                           - Repeat colonoscopy is recommended for                            surveillance. The colonoscopy date will be                            determined after pathology results from today's                            exam become available for review. Shakendra Griffeth L. Loletha Carrow, MD 03/23/2020 10:31:15 AM This report has been signed electronically.

## 2020-03-23 NOTE — Progress Notes (Signed)
PT taken to PACU. Monitors in place. VSS. Report given to RN. 

## 2020-03-23 NOTE — Patient Instructions (Signed)
YOU HAD AN ENDOSCOPIC PROCEDURE TODAY AT THE [facility]:   Refer to the procedure report that was given to you for any specific questions about what was found during the examination.  If the procedure report does not answer your questions, please call your gastroenterologist to clarify.  If you requested that your care partner not be given the details of your procedure findings, then the procedure report has been included in a sealed envelope for you to review at your convenience later.  YOU SHOULD EXPECT: Some feelings of bloating in the abdomen. Passage of more gas than usual.  Walking can help get rid of the air that was put into your GI tract during the procedure and reduce the bloating. If you had a lower endoscopy (such as a colonoscopy or flexible sigmoidoscopy) you may notice spotting of blood in your stool or on the toilet paper. If you underwent a bowel prep for your procedure, you may not have a normal bowel movement for a few days.  Please Note:  You might notice some irritation and congestion in your nose or some drainage.  This is from the oxygen used during your procedure.  There is no need for concern and it should clear up in a day or so.  SYMPTOMS TO REPORT IMMEDIATELY:   Following lower endoscopy (colonoscopy or flexible sigmoidoscopy):  Excessive amounts of blood in the stool  Significant tenderness or worsening of abdominal pains  Swelling of the abdomen that is new, acute  Fever of 100F or higher   Black, tarry-looking stools  For urgent or emergent issues, a gastroenterologist can be reached at any hour by calling [phone]. Do not use MyChart messaging for urgent concerns.    DIET:  We do recommend a small meal at first, but then you may proceed to your regular diet.  Drink plenty of fluids but you should avoid alcoholic beverages for 24 hours.  MEDICATIONS: Continue present medications.  Please see handouts given to you by your recovery  nurse.  ACTIVITY:  You should plan to take it easy for the rest of today and you should NOT DRIVE or use heavy machinery until tomorrow (because of the sedation medicines used during the test).    FOLLOW UP: Our staff will call the number listed on your records 48-72 hours following your procedure to check on you and address any questions or concerns that you may have regarding the information given to you following your procedure. If we do not reach you, we will leave a message.  We will attempt to reach you two times.  During this call, we will ask if you have developed any symptoms of COVID 19. If you develop any symptoms (ie: fever, flu-like symptoms, shortness of breath, cough etc.) before then, please call [phone].  If you test positive for Covid 19 in the 2 weeks post procedure, please call and report this information to us.    If any biopsies were taken you will be contacted by phone or by letter within the next 1-3 weeks.  Please call us at [phone] if you have not heard about the biopsies in 3 weeks.   Thank you for allowing us to provide for your healthcare needs today.   SIGNATURES/CONFIDENTIALITY: You and/or your care partner have signed paperwork which will be entered into your electronic medical record.  These signatures attest to the fact that that the information above on your After Visit Summary has been reviewed and is understood.    Full responsibility of the confidentiality of this discharge information lies with you and/or your care-partner.

## 2020-03-28 ENCOUNTER — Other Ambulatory Visit: Payer: Self-pay | Admitting: Cardiovascular Disease

## 2020-03-28 ENCOUNTER — Telehealth: Payer: Self-pay

## 2020-03-28 ENCOUNTER — Encounter: Payer: Self-pay | Admitting: Gastroenterology

## 2020-03-28 NOTE — Telephone Encounter (Signed)
°  Follow up Call-  Call back number 03/23/2020  Post procedure Call Back phone  # [phone]  Permission to leave phone message Yes  Some recent data might be hidden     Patient questions:  Do you have a fever, pain , or abdominal swelling? No. Pain Score  0 *  Have you tolerated food without any problems? Yes.    Have you been able to return to your normal activities? Yes.    Do you have any questions about your discharge instructions: Diet   No. Medications  No. Follow up visit  No.  Do you have questions or concerns about your Care? No.  Actions: * If pain score is 4 or above: No action needed, pain <4.  1. Have you developed a fever since your procedure? no  2.   Have you had an respiratory symptoms (SOB or cough) since your procedure? no  3.   Have you tested positive for COVID 19 since your procedure no  4.   Have you had any family members/close contacts diagnosed with the COVID 19 since your procedure?  no   If yes to any of these questions please route to Joylene John, RN and Erenest Rasher, RN

## 2020-04-03 DIAGNOSIS — R0781 Pleurodynia: Secondary | ICD-10-CM | POA: Insufficient documentation

## 2020-05-22 ENCOUNTER — Other Ambulatory Visit: Payer: Self-pay | Admitting: Internal Medicine

## 2020-05-22 DIAGNOSIS — R0781 Pleurodynia: Secondary | ICD-10-CM

## 2020-06-05 ENCOUNTER — Other Ambulatory Visit: Payer: Self-pay | Admitting: Internal Medicine

## 2020-06-20 ENCOUNTER — Other Ambulatory Visit: Payer: BC Managed Care – PPO

## 2021-10-11 ENCOUNTER — Emergency Department (HOSPITAL_BASED_OUTPATIENT_CLINIC_OR_DEPARTMENT_OTHER)
Admission: EM | Admit: 2021-10-11 | Discharge: 2021-10-11 | Disposition: A | Discharge status: Home / Self Care | Payer: Managed Care, Other (non HMO) | Attending: Emergency Medicine | Admitting: Emergency Medicine

## 2021-10-11 ENCOUNTER — Emergency Department (HOSPITAL_BASED_OUTPATIENT_CLINIC_OR_DEPARTMENT_OTHER): Payer: Managed Care, Other (non HMO)

## 2021-10-11 ENCOUNTER — Encounter (HOSPITAL_BASED_OUTPATIENT_CLINIC_OR_DEPARTMENT_OTHER): Payer: Self-pay

## 2021-10-11 ENCOUNTER — Other Ambulatory Visit: Payer: Self-pay

## 2021-10-11 DIAGNOSIS — Z7982 Long term (current) use of aspirin: Secondary | ICD-10-CM | POA: Insufficient documentation

## 2021-10-11 DIAGNOSIS — R4701 Aphasia: Secondary | ICD-10-CM

## 2021-10-11 DIAGNOSIS — I69322 Dysarthria following cerebral infarction: Secondary | ICD-10-CM | POA: Diagnosis not present

## 2021-10-11 LAB — COMPREHENSIVE METABOLIC PANEL
ALT: 28 U/L (ref 0–44)
AST: 23 U/L (ref 15–41)
Albumin: 4.2 g/dL (ref 3.5–5.0)
Alkaline Phosphatase: 51 U/L (ref 38–126)
Anion gap: 7 (ref 5–15)
BUN: 25 mg/dL — ABNORMAL HIGH (ref 8–23)
CO2: 25 mmol/L (ref 22–32)
Calcium: 9.8 mg/dL (ref 8.9–10.3)
Chloride: 104 mmol/L (ref 98–111)
Creatinine, Ser: 1.26 mg/dL — ABNORMAL HIGH (ref 0.61–1.24)
GFR, Estimated: >60 mL/min (ref >60)
Glucose, Bld: 107 mg/dL — ABNORMAL HIGH (ref 70–99)
Potassium: 3.9 mmol/L (ref 3.5–5.1)
Sodium: 136 mmol/L (ref 135–145)
Total Bilirubin: 0.5 mg/dL (ref 0.3–1.2)
Total Protein: 6.4 g/dL — ABNORMAL LOW (ref 6.5–8.1)

## 2021-10-11 LAB — DIFFERENTIAL
Abs Immature Granulocytes: 0.03 10*3/uL (ref 0.00–0.07)
Basophils Absolute: 0 10*3/uL (ref 0.0–0.1)
Basophils Relative: 1 %
Eosinophils Absolute: 0.1 10*3/uL (ref 0.0–0.5)
Eosinophils Relative: 2 %
Immature Granulocytes: 1 %
Lymphocytes Relative: 37 %
Lymphs Abs: 2.4 10*3/uL (ref 0.7–4.0)
Monocytes Absolute: 0.6 10*3/uL (ref 0.1–1.0)
Monocytes Relative: 9 %
Neutro Abs: 3.4 10*3/uL (ref 1.7–7.7)
Neutrophils Relative %: 50 %

## 2021-10-11 LAB — CBC
HCT: 43.7 % (ref 39.0–52.0)
Hemoglobin: 14.6 g/dL (ref 13.0–17.0)
MCH: 32.3 pg (ref 26.0–34.0)
MCHC: 33.4 g/dL (ref 30.0–36.0)
MCV: 96.7 fL (ref 80.0–100.0)
Platelets: 122 10*3/uL — ABNORMAL LOW (ref 150–400)
RBC: 4.52 MIL/uL (ref 4.22–5.81)
RDW: 13.2 % (ref 11.5–15.5)
WBC: 6.6 10*3/uL (ref 4.0–10.5)
nRBC: 0 % (ref 0.0–0.2)

## 2021-10-11 LAB — CBG MONITORING, ED: Glucose-Capillary: 106 mg/dL — ABNORMAL HIGH (ref 70–99)

## 2021-10-11 LAB — PROTIME-INR
INR: 1 (ref 0.8–1.2)
Prothrombin Time: 12.7 seconds (ref 11.4–15.2)

## 2021-10-11 LAB — APTT: aPTT: 25 seconds (ref 24–36)

## 2021-10-11 MED ORDER — IOHEXOL 350 MG/ML SOLN
75.0000 mL | Freq: Once | INTRAVENOUS | Status: AC | PRN
  Administered 2021-10-11: 75 mL via INTRAVENOUS

## 2021-10-11 NOTE — ED Triage Notes (Signed)
Pt had a sudden onset loss of memory and expressive aphasia with LKW at 17:30. Per pt's wife pt would say the wrong words at times. Pt has had resolution of some of symptoms at time of triage. No focal neuro symptoms noted during triage assessment.

## 2021-10-11 NOTE — ED Provider Notes (Signed)
[facility] EMERGENCY DEPT Provider Note   CSN: [id number] Arrival date & time: 10/11/21  2951     History  CC: Difficulty with speech   Mike Arias is a 71 y.o. male with history of high cholesterol, presenting to emergency department with episode of expressive aphasia.  History is provided by the patient and his wife at bedside.  He reports around 6 PM this evening, they were having a discussion, the patient having difficulty getting his words out.  His wife followed him into his room, where he was confusing objects.  Patient was aware of this entire episode.  He says a similar thing has happened years ago when he was evaluated in the hospital for possible TIA.  He has never been diagnosed with a stroke.  He says since arriving in the ER he feels completely back to baseline.  His wife agrees that he is back to baseline.  He denies numbness or weakness.  He denies headache.  Medical record review shows the patient an MRI of the brain performed in April 2020 which was largely unremarkable.  This was ordered by his neurologist out of concern for TIA, the patient had an episode of forgetfulness and difficulty speaking at that time.  It lasted 5 to 20 minutes.  He also underwent a code stroke activation in May 2018, at that time had acute onset of the headache with right eye blurriness.  MRI of the brain was unremarkable at that time, aside from chronic microvascular ischemic changes.  HPI     Home Medications Prior to Admission medications   Medication Sig Start Date End Date Taking? Authorizing Provider  aspirin 325 MG tablet Take 325 mg by mouth daily.    [provider]  rosuvastatin (CRESTOR) 10 MG tablet TAKE ONE TABLET BY MOUTH DAILY 03/28/20   Nahser, Wonda Cheng, MD  valACYclovir (VALTREX) 500 MG tablet Take 500 mg by mouth daily as needed. Patient not taking: Reported on 03/23/2020    [provider]      Allergies    Patient has no known allergies.     Review of Systems   Review of Systems  Constitutional:  Negative for chills and fever.  HENT:  Negative for ear pain and sore throat.   Eyes:  Negative for pain and visual disturbance.  Respiratory:  Negative for cough and shortness of breath.   Cardiovascular:  Negative for chest pain and palpitations.  Gastrointestinal:  Negative for abdominal pain and vomiting.  Musculoskeletal:  Negative for arthralgias and back pain.  Skin:  Negative for color change and rash.  Neurological:  Negative for seizures, syncope, facial asymmetry, speech difficulty, weakness, light-headedness, numbness and headaches.  All other systems reviewed and are negative.  Physical Exam Updated Vital Signs BP (!) 125/92 (BP Location: Right Arm)    Pulse 60    Temp 97.8 F (36.6 C) (Oral)    Resp 19    Ht 6\' 1"  (1.854 m)    Wt 88.5 kg    SpO2 98%    BMI 25.73 kg/m  Physical Exam Constitutional:      General: He is not in acute distress. HENT:     Head: Normocephalic and atraumatic.  Eyes:     Conjunctiva/sclera: Conjunctivae normal.     Pupils: Pupils are equal, round, and reactive to light.  Cardiovascular:     Rate and Rhythm: Normal rate and regular rhythm.  Pulmonary:     Effort: Pulmonary effort is normal. No  respiratory distress.  Abdominal:     General: There is no distension.     Tenderness: There is no abdominal tenderness.  Skin:    General: Skin is warm and dry.  Neurological:     General: No focal deficit present.     Mental Status: He is alert and oriented to person, place, and time. Mental status is at baseline.     GCS: GCS eye subscore is 4. GCS verbal subscore is 5. GCS motor subscore is 6.     Sensory: No sensory deficit.     Motor: No weakness.     Coordination: Romberg sign negative. Finger-Nose-Finger Test normal.  Psychiatric:        Mood and Affect: Mood normal.        Behavior: Behavior normal.    ED Results / Procedures / Treatments   Labs (all labs ordered are  listed, but only abnormal results are displayed) Labs Reviewed  CBC - Abnormal; Notable for the following components:      Result Value   Platelets 122 (*)    All other components within normal limits  COMPREHENSIVE METABOLIC PANEL - Abnormal; Notable for the following components:   Glucose, Bld 107 (*)    BUN 25 (*)    Creatinine, Ser 1.26 (*)    Total Protein 6.4 (*)    All other components within normal limits  CBG MONITORING, ED - Abnormal; Notable for the following components:   Glucose-Capillary 106 (*)    All other components within normal limits  PROTIME-INR  APTT  DIFFERENTIAL    EKG EKG Interpretation  Date/Time:  Thursday October 11 2021 19:07:11 EST Ventricular Rate:  61 PR Interval:  190 QRS Duration: 92 QT Interval:  418 QTC Calculation: 420 R Axis:   13 Text Interpretation: Normal sinus rhythm Anterior infarct (cited on or before 11-Apr-2010) Abnormal ECG When compared with ECG of 27-Jan-2017 15:51, Questionable change in initial forces of Anterior leads Confirmed by Octaviano Glow [phone]) on 10/11/2021 7:24:28 PM  Radiology CT ANGIO HEAD NECK W WO CM  Result Date: 10/11/2021 CLINICAL DATA:  Initial evaluation for acute stroke. EXAM: CT ANGIOGRAPHY HEAD AND NECK TECHNIQUE: Multidetector CT imaging of the head and neck was performed using the standard protocol during bolus administration of intravenous contrast. Multiplanar CT image reconstructions and MIPs were obtained to evaluate the vascular anatomy. Carotid stenosis measurements (when applicable) are obtained utilizing NASCET criteria, using the distal internal carotid diameter as the denominator. RADIATION DOSE REDUCTION: This exam was performed according to the departmental dose-optimization program which includes automated exposure control, adjustment of the mA and/or kV according to patient size and/or use of iterative reconstruction technique. CONTRAST:  49mL OMNIPAQUE IOHEXOL 350 MG/ML SOLN COMPARISON:   None. FINDINGS: CT HEAD FINDINGS Brain: Age-related cerebral atrophy with mild chronic small vessel ischemic disease. No acute intracranial hemorrhage. No acute large vessel territory infarct. No mass lesion, midline shift or mass effect no hydrocephalus or extra-axial fluid collection. Vascular: No hyperdense vessel. Skull: Scalp soft tissues and calvarium within normal limits. Sinuses: Clear. Orbits: Visualized globes and orbital soft tissues demonstrate no acute finding. Review of the MIP images confirms the above findings CTA NECK FINDINGS Aortic arch: Visualized aortic arch normal caliber with normal branch pattern. Mild atheromatous change about the arch itself. No stenosis about the origin of the great vessels. Right carotid system: Right common and internal carotid arteries patent without stenosis or dissection. Mild atheromatous change about the right carotid bulb without stenosis. Left  carotid system: Left common and internal carotid arteries patent without stenosis or dissection. Mild atheromatous change about the left carotid bulb without stenosis. Vertebral arteries: Left vertebral artery hypoplastic, with the strongly dominant right vertebral artery. Atheromatous change at the origin of the right vertebral artery with moderate approximate 50% stenosis. Vertebral arteries otherwise patent without stenosis or dissection. Skeleton: No discrete or worrisome osseous lesions. Moderate spondylosis present at C6-7. Other neck: No other acute soft tissue abnormality within the neck. 4 mm left thyroid nodule noted of doubtful significance given size and patient age no follow-up imaging recommended (ref: J Am Coll Radiol. 2015 Feb;12(2): 143-50). Upper chest: Visualized upper chest demonstrates no acute finding. Review of the MIP images confirms the above findings CTA HEAD FINDINGS Anterior circulation: Petrous segments patent bilaterally. Mild atheromatous change within the carotid siphons without significant  stenosis. A1 segments, anterior communicating complex common anterior cerebral arteries widely patent without stenosis. No M1 stenosis or occlusion. Distal MCA branches well perfused and symmetric. Posterior circulation: Both V4 segments patent to the vertebrobasilar junction without significant stenosis. Both PICA origins patent and normal. Basilar patent to its distal aspect without stenosis. Superior cerebellar and posterior cerebral arteries patent bilaterally. Venous sinuses: Patent allowing for timing the contrast bolus. Anatomic variants: None significant.  No aneurysm. Review of the MIP images confirms the above findings IMPRESSION: CT HEAD IMPRESSION: 1. No acute intracranial abnormality. 2. Age-related cerebral atrophy with mild chronic small vessel ischemic disease. CTA HEAD AND NECK IMPRESSION: 1. Negative CTA for large vessel occlusion or other acute vascular abnormality. 2. Atheromatous change at the origin of the right vertebral artery with resultant moderate approximate 50% stenosis. 3. Additional mild for age atheromatous change elsewhere about the major arterial vasculature of the head and neck. No other hemodynamically significant or correctable stenosis. Electronically Signed   By: Jeannine Boga M.D.   On: 10/11/2021 21:02    Procedures Procedures    Medications Ordered in ED Medications  iohexol (OMNIPAQUE) 350 MG/ML injection 75 mL (75 mLs Intravenous Contrast Given 10/11/21 1950)    ED Course/ Medical Decision Making/ A&P  Medical Decision Making Amount and/or Complexity of Data Reviewed Labs: ordered. Radiology: ordered.  Risk Prescription drug management.   This patient presents to the ED with concern for transient speech difficulty, now resolved. This involves an extensive number of treatment options, and is a complaint that carries with it a high risk of complications and morbidity.  The differential diagnosis includes TIA vs CVA vs complex migraine vs stress  response vs other  Additional history obtained from patient's wife at bedside  External records from outside source obtained and reviewed including neurology outpatient evaluation and MRI from 2020, echo + carotid imaging from 2020.  I ordered and personally interpreted labs.  The pertinent results include:  labs unremarkable  I ordered imaging studies including CTA head and neck I independently visualized and interpreted imaging which showed no acute CVA, no critical stenosis or vascular injury I agree with the radiologist interpretation  The patient was maintained on a cardiac monitor.  I personally viewed and interpreted the cardiac monitored which showed an underlying rhythm of: NSR  Per my interpretation the patient's ECG shows sinus rhythm   Test Considered: MRI considered for TIA follow up -patient did not want this imaging at this time - see ED course - Lower suspicion for ACS/aortic dissection or PE - did not feel emergent CT imaging of the chest was indicated at this time  I requested  consultation with the neurologist Dr Leonel Ramsay,  and discussed lab and imaging findings as well as pertinent plan - they recommend: MRI imaging of the brain for TIA evaluation; if patient were to refuse, he would need close outpatient neurology follow up.  Differential could include complex migraines.  No medication adjustments recommended at this time.   After the interventions noted above, I reevaluated the patient and found that they have: stayed the same - asymptomatic throughout stay in ED  Dispostion:   Clinical Course as of 10/12/21 0030  Thu Oct 11, 2021  2154 After several long discussions with the patient and his wife, regarding further work-up for TIA including MRI of the brain, they have decided they would not go to [facility] at this time for MRI of the brain.  They understand that we have not excluded the possibility of a transient stroke, and that this may be a harbinger of  larger stroke if left undiagnosed, but they would prefer to come back if they develop any other strokelike symptoms.  They understand that leaving without an MRI to complete a workup is not the standard of care.    Given that he has had a normal echocardiogram and fairly unremarkable carotid duplex only 2 years ago, as well as no significant stenosis or findings of CT angio today, I do feel that his likelihood of embolic event is relatively low. I also believe he and his wife understand the risks of their decision to leave at this time.    Return precautions were strictly delineated.  He will follow-up with his own neurologist early next week, and return if new or worsening symptoms. [MT]    Clinical Course User Index [MT] Guss Farruggia, Carola Rhine, MD           Final Clinical Impression(s) / ED Diagnoses Final diagnoses:  Expressive aphasia    Rx / DC Orders ED Discharge Orders     None         Lolly Glaus, Carola Rhine, MD 10/12/21 [phone]

## 2021-10-11 NOTE — Discharge Instructions (Signed)
Please follow-up with your neurologist as soon as possible.  As I explained, it is impossible to rule out a mini stroke or TIA without an MRI of the brain.  He did not want to stay at [facility] for further imaging including MRI scan.  You understand the risks of leaving without an MRI which would include large stroke, brain damage, or death.  If you develop any strokelike symptoms, including difficulty speaking, blurred vision, numbness or weakness, severe headache, or loss of use of 1 arm or leg, please call 911 and come back to the ER immediately.

## 2021-10-18 ENCOUNTER — Telehealth: Payer: Self-pay

## 2021-10-18 NOTE — Telephone Encounter (Signed)
Left voicemail for patient regarding ED follow up for aphasia and TIA. Per Dr Jaynee Eagles, get patient scheduled with Dr Leonie Man for a one time work up (Dr Leonie Man did agree) and then patient can follow up with Dr Jaynee Eagles. Called and LVM to schedule for next available appointment with the possibility of moving appointment up if we have a cancellation.

## 2021-10-18 NOTE — Telephone Encounter (Signed)
I spoke with Dr Leonie Man regarding this patient, he is asking that the patient be worked into his schedule at a 12:30 appointment or an afternoon appointment. If you could please help me locate an appointment the next week Dr Leonie Man is in the office, it would be greatly appreciated.   Thanks!

## 2021-10-29 ENCOUNTER — Encounter: Payer: Self-pay | Admitting: Neurology

## 2021-10-29 ENCOUNTER — Ambulatory Visit (INDEPENDENT_AMBULATORY_CARE_PROVIDER_SITE_OTHER): Payer: Managed Care, Other (non HMO) | Admitting: Neurology

## 2021-10-29 ENCOUNTER — Telehealth: Payer: Self-pay | Admitting: Neurology

## 2021-10-29 VITALS — BP 129/80 | HR 50 | Ht 73.0 in | Wt 196.8 lb

## 2021-10-29 DIAGNOSIS — G459 Transient cerebral ischemic attack, unspecified: Secondary | ICD-10-CM

## 2021-10-29 DIAGNOSIS — R4789 Other speech disturbances: Secondary | ICD-10-CM | POA: Diagnosis not present

## 2021-10-29 DIAGNOSIS — R4701 Aphasia: Secondary | ICD-10-CM

## 2021-10-29 NOTE — Telephone Encounter (Signed)
medicare/cigna order sent to GI, they will obtain the auth for C[employer] and reach out to the patient to schedule.

## 2021-10-29 NOTE — Patient Instructions (Addendum)
I had a long discussion with the patient regarding his 3 recurrent transient stereotypical episodes of expressive speech difficulties being of unclear etiology.  Left hemispheric TIAs would be less likely given the odds of having exactly the same TIA with negative neurovascular work-up being quite low.  I would recommend further evaluation with checking EEG for any cortical irritability or seizures as well as repeating MRI scan of the brain with and without contrast and checking memory panel labs.  Episodes are not frequent enough at the present time to justify a trial of medication like Depakote seizures/migraine prophylaxis.  May consider PET scan later to look for primary progressive aphasia like neurodegenerative disorder.  He will stay on aspirin for stroke prevention and maintain aggressive control of lipids with LDL goal below 70 mg percent and hypertension blood pressure goal below 130/90 and diabetes with hemoglobin A1c goal below 6.5%.  He will return for follow-up in 4 months or call earlier if necessary.

## 2021-10-29 NOTE — Progress Notes (Signed)
[facility] [address] [place]. [place] [address] [phone]       OFFICE CONSULT NOTE  Mike Arias Date of Birth:  December 22, 1950 Medical Record Number:  [id number]   Referring MD: Sarina Ill  Reason for Referral: TIA  HPI: Mike Arias is a 71 year old pleasant Caucasian male seen today for initial office consultation visit for episode of expressive aphasia.  History is obtained from the patient, review of electronic medical records I have personally reviewed pertinent available imaging films in PACS.  He has past medical history of hyperlipidemia and gastroesophageal reflux disease.  Presented to the ER on 10/11/2021 with sudden onset of difficulty speaking and get getting his words out.  He states he had gone outside and picked up some pizza for dinner and is came home and tried to talk to his wife he knew what he wanted to say but the words did not come out.  He could hear what his wife was saying but he had trouble applying to her.  He was fully aware of his surroundings and knew what happened.  His wife insisted he go to the ER and by the time he reached the ER in about 10 to 15 minutes he was able to speak to the registration desk person and was quickly back to his baseline.  He denied any accompanying headache, blurred vision, numbness, tingling, extremity weakness gait or balance problem accompanying this speech difficulty.  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  Patient had a similar episode previously in April 2020 at that time he had an MRI scan of the brain on 01/05/2019 which showed only minor changes of chronic small vessel disease no acute abnormality.  MR angiogram of the brain at that time was normal.  Carotid ultrasound had shown only minor none significant plaque bilaterally.  He may have had some confusion during this  episode as he could not remember colleagues name.  He had a third episode possibly in May after 2018 which is listed in the ER note has altered mental status and an MRI at that time also was fairly unremarkable except for changes of small vessel disease.  Patient denies headache accompanying all of these episodes and denies history of significant migraines.  He has no prior history of seizures.  He is fully aware during these episodes of his surroundings does not lose consciousness.  Denies episodes of syncope or palpitations or loss of consciousness.  He has not had an EEG yet.  He denies any prior history of significant head injury with loss of consciousness, childhood febrile seizures, meningitis or encephalitis.  Patient was seen by Dr.Ahern in our office in  2020 but has been referred back to me for my opinion this time  ROS:   14 system review of systems is positive for speech difficulties, trouble hearing and understanding and all other systems negative  PMH:  Past Medical History:  Diagnosis Date   GERD (gastroesophageal reflux disease)    HLD (hyperlipidemia)    TIA (transient ischemic attack)    ? TIA    Social History:  Social History   Socioeconomic History   Marital status: Married    Spouse name: Not on file   Number of children: Not on file   Years of education: Not on file   Highest education level: Not on file  Occupational History  Not on file  Tobacco Use   Smoking status: Never   Smokeless tobacco: Never   Tobacco comments:    maybe a cigar on occasion  Vaping Use   Vaping Use: Never used  Substance and Sexual Activity   Alcohol use: Yes    Comment: 4 days a week   Drug use: No   Sexual activity: Not on file  Other Topics Concern   Not on file  Social History Narrative   Not on file   Social Determinants of Health   Financial Resource Strain: Not on file  Food Insecurity: Not on file  Transportation Needs: Not on file  Physical Activity: Not on file   Stress: Not on file  Social Connections: Not on file  Intimate Partner Violence: Not on file    Medications:   Current Outpatient Medications on File Prior to Visit  Medication Sig Dispense Refill   Aspirin 81 MG CAPS Take 325 mg by mouth daily.     b complex vitamins capsule Take 1 capsule by mouth daily.     rosuvastatin (CRESTOR) 10 MG tablet TAKE ONE TABLET BY MOUTH DAILY 90 tablet 2   valACYclovir (VALTREX) 500 MG tablet Take 500 mg by mouth daily as needed.     Current Facility-Administered Medications on File Prior to Visit  Medication Dose Route Frequency Provider Last Rate Last Admin   0.9 %  sodium chloride infusion  500 mL Intravenous Continuous Danis, Estill Cotta III, MD       0.9 %  sodium chloride infusion  500 mL Intravenous Once Nelida Meuse III, MD        Allergies:  No Known Allergies  Physical Exam General: well developed, well nourished pleasant Caucasian male, seated, in no evident distress Head: head normocephalic and atraumatic.   Neck: supple with no carotid or supraclavicular bruits Cardiovascular: regular rate and rhythm, no murmurs Musculoskeletal: no deformity Skin:  no rash/petichiae Vascular:  Normal pulses all extremities  Neurologic Exam Mental Status: Awake and fully alert. Oriented to place and time. Recent and remote memory intact. Attention span, concentration and fund of knowledge appropriate. Mood and affect appropriate.  Mini-Mental status exam scored 30/30.  Able to name 24 animals which can walk on 4 legs.  Clock drawing 3/4.   Cranial Nerves: Fundoscopic exam reveals sharp disc margins. Pupils equal, briskly reactive to light. Extraocular movements full without nystagmus. Visual fields full to confrontation. Hearing diminished bilaterally.  Facial sensation intact. Face, tongue, palate moves normally and symmetrically.  Motor: Normal bulk and tone. Normal strength in all tested extremity muscles. Sensory.: intact to touch , pinprick ,  position and vibratory sensation.  Coordination: Rapid alternating movements normal in all extremities. Finger-to-nose and heel-to-shin performed accurately bilaterally. Gait and Station: Arises from chair without difficulty. Stance is normal. Gait demonstrates normal stride length and balance . Able to heel, toe and tandem walk without difficulty.  Reflexes: 1+ and symmetric. Toes downgoing.   NIHSS  0 Modified Rankin  0   ASSESSMENT: 71 year old Caucasian male with recurrent transient stereotypical episodes of expressive language difficulties of unclear etiology.  Left hemispheric TIAs will be unusual given unremarkable neurovascular studies.  Complicated migraine versus seizures is also a possibility.  Beginning of primary progressive aphasia like neurodegenerative disorder is less likely given his excellent cognitive status     PLAN:I had a long discussion with the patient regarding his 3 recurrent transient stereotypical episodes of expressive speech difficulties being of unclear etiology.  Left  hemispheric TIAs would be less likely given the odds of having exactly the same TIA with negative neurovascular work-up being quite low.  I would recommend further evaluation with checking EEG for any cortical irritability or seizures as well as repeating MRI scan of the brain with and without contrast and checking memory panel labs.  Episodes are not frequent enough at the present time to justify a trial of medication like Depakote seizures/migraine prophylaxis.  May consider PET scan later to look for primary progressive aphasia like neurodegenerative disorder.  He will stay on aspirin for stroke prevention and maintain aggressive control of lipids with LDL goal below 70 mg percent and hypertension blood pressure goal below 130/90 and diabetes with hemoglobin A1c goal below 6.5%.  He will return for follow-up in 4 months or call earlier if necessary.  Greater than 50% time during this 45-minute visit was  spent in counseling and coordination of care about his episodes of transient speech difficulties and discussing differential diagnosis and evaluation plan and answering questions  Antony Contras, MD Note: This document was prepared with digital dictation and possible smart phrase technology. Any transcriptional errors that result from this process are unintentional.

## 2021-10-30 ENCOUNTER — Telehealth: Payer: Self-pay | Admitting: *Deleted

## 2021-10-30 LAB — DEMENTIA PANEL
Homocysteine: 10.9 umol/L (ref 0.0–17.2)
RPR Ser Ql: NONREACTIVE
TSH: 2.04 u[IU]/mL (ref 0.450–4.500)
Vitamin B-12: 322 pg/mL (ref 232–1245)

## 2021-10-30 NOTE — Progress Notes (Signed)
Kindly inform the patient that vitamin B12 and thyroid hormone levels were normal.  Test for syphilis was negative

## 2021-10-30 NOTE — Telephone Encounter (Signed)
Left patient a detailed message, with results, on voicemail (ok per DPR).  Provided our number to call back with any questions.  

## 2021-10-30 NOTE — Telephone Encounter (Signed)
-----   Message from Garvin Fila, MD sent at 10/30/2021  3:14 PM EST ----- Kindly inform the patient that vitamin B12 and thyroid hormone levels were normal.  Test for syphilis was negative

## 2021-11-06 ENCOUNTER — Ambulatory Visit (INDEPENDENT_AMBULATORY_CARE_PROVIDER_SITE_OTHER): Payer: Managed Care, Other (non HMO) | Admitting: Neurology

## 2021-11-06 ENCOUNTER — Other Ambulatory Visit: Payer: Self-pay

## 2021-11-06 DIAGNOSIS — R4701 Aphasia: Secondary | ICD-10-CM

## 2021-11-06 DIAGNOSIS — R4789 Other speech disturbances: Secondary | ICD-10-CM

## 2021-11-19 ENCOUNTER — Encounter: Payer: Self-pay | Admitting: *Deleted

## 2021-12-11 ENCOUNTER — Telehealth: Payer: Self-pay | Admitting: Neurology

## 2021-12-11 NOTE — Telephone Encounter (Signed)
Noted, GI has tried to reach out to the patient several times.. ? ?11/01/21 2nd attempt LMOM Epic MW ? ?10/30/21 1ST ATTEMPT-LMOM-EPIC ORDER/BG ?

## 2021-12-11 NOTE — Telephone Encounter (Signed)
Pt called stating that he has not heard anything about scheduling an MRI appt and that no one has reached out to him and that he does not understand why due to his and I quote "serious condition" this has not been taken care of as of yet. I informed pt that there has been several attempts starting in the beginning of Feb to reach him to get the MRI scheduled. Pt stated he "never got any of those messages". Pt was given the GI phone number to call and get the MRI scheduled.  ?

## 2021-12-27 ENCOUNTER — Other Ambulatory Visit: Payer: Managed Care, Other (non HMO)

## 2021-12-28 ENCOUNTER — Ambulatory Visit
Admission: RE | Admit: 2021-12-28 | Discharge: 2021-12-28 | Disposition: A | Discharge status: Home / Self Care | Payer: Managed Care, Other (non HMO) | Source: Ambulatory Visit | Attending: Neurology | Admitting: Neurology

## 2021-12-28 DIAGNOSIS — G459 Transient cerebral ischemic attack, unspecified: Secondary | ICD-10-CM | POA: Diagnosis not present

## 2021-12-28 DIAGNOSIS — R4701 Aphasia: Secondary | ICD-10-CM | POA: Diagnosis not present

## 2021-12-28 MED ORDER — GADOBENATE DIMEGLUMINE 529 MG/ML IV SOLN
18.0000 mL | Freq: Once | INTRAVENOUS | Status: AC | PRN
  Administered 2021-12-28: 18 mL via INTRAVENOUS

## 2022-02-06 ENCOUNTER — Telehealth: Payer: Self-pay | Admitting: Neurology

## 2022-02-06 NOTE — Telephone Encounter (Signed)
LVM and sent mychart msg informing pt of r/s needed for 6/28 appt- Dr. Leonie Man out. ?

## 2022-02-21 ENCOUNTER — Ambulatory Visit (INDEPENDENT_AMBULATORY_CARE_PROVIDER_SITE_OTHER): Payer: Managed Care, Other (non HMO) | Admitting: Neurology

## 2022-02-21 VITALS — BP 135/84 | HR 62 | Ht 73.0 in | Wt 190.0 lb

## 2022-02-21 DIAGNOSIS — R4789 Other speech disturbances: Secondary | ICD-10-CM

## 2022-02-21 DIAGNOSIS — I2510 Atherosclerotic heart disease of native coronary artery without angina pectoris: Secondary | ICD-10-CM

## 2022-02-21 DIAGNOSIS — G3184 Mild cognitive impairment, so stated: Secondary | ICD-10-CM

## 2022-02-21 NOTE — Progress Notes (Signed)
[facility] [address] [place]. [place] [address] [phone]       OFFICE FOLLOW UP VISIT NOTE  [name] Date of Birth:  05/20/1951 Medical Record Number:  [id number]   Referring MD: Sarina Ill  Reason for Referral: TIA  HPI: Initial visit 10/29/2021 :Mike Arias is a 71 year old pleasant Caucasian male seen today for initial office consultation visit for episode of expressive aphasia.  History is obtained from the patient, review of electronic medical records I have personally reviewed pertinent available imaging films in PACS.  He has past medical history of hyperlipidemia and gastroesophageal reflux disease.  Presented to the ER on 10/11/2021 with sudden onset of difficulty speaking and get getting his words out.  He states he had gone outside and picked up some pizza for dinner and is came home and tried to talk to his wife he knew what he wanted to say but the words did not come out.  He could hear what his wife was saying but he had trouble applying to her.  He was fully aware of his surroundings and knew what happened.  His wife insisted he go to the ER and by the time he reached the ER in about 10 to 15 minutes he was able to speak to the registration desk person and was quickly back to his baseline.  He denied any accompanying headache, blurred vision, numbness, tingling, extremity weakness gait or balance problem accompanying this speech difficulty.  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  Patient had a similar episode previously in April 2020 at that time he had an MRI scan of the brain on 01/05/2019 which showed only minor changes of chronic small vessel disease no acute abnormality.  MR angiogram of the brain at that time was normal.  Carotid ultrasound had shown only minor none significant plaque bilaterally.  He may have had  some confusion during this episode as he could not remember colleagues name.  He had a third episode possibly in May after 2018 which is listed in the ER note has altered mental status and an MRI at that time also was fairly unremarkable except for changes of small vessel disease.  Patient denies headache accompanying all of these episodes and denies history of significant migraines.  He has no prior history of seizures.  He is fully aware during these episodes of his surroundings does not lose consciousness.  Denies episodes of syncope or palpitations or loss of consciousness.  He has not had an EEG yet.  He denies any prior history of significant head injury with loss of consciousness, childhood febrile seizures, meningitis or encephalitis.  Patient was seen by Dr.Ahern in our office in  2020 but has been referred back to me for my opinion this time Update 02/21/2022 ; he returns for follow-up after last visit 4 months ago.  Patient states he is doing well and is no further episodes of speech disturbance.  He has had a total of 3 episodes over 4 years but none since last visit.  He had lab work on 10/29/2021 and vitamin B12, TSH, homocystine and test for syphilis were all normal.  EEG on 11/15/2021 was also normal.  MRI scan of the brain on 12/27/2021 showed mild changes of generalized atrophy and white matter changes which are age-appropriate.  MR angiogram of the brain was normal.  White  matter changes and atrophy is slightly progressed compared to MRI from 01/05/2019.  Patient remains on aspirin which is tolerating well with only minor bruising but no bleeding.  He remains on Crestor tolerating well without muscle aches and pains.  His blood pressure is well controlled today it is 135/84.  He feels he has mild subjective memory difficulties but on testing in the office today his recall was 3/3 and was able to name 21 animals which can walk on 4 legs and clock drawing was 4/4. ROS:   14 system review of systems is  positive for speech difficulties, memory difficulties, trouble hearing and understanding and all other systems negative  PMH:  Past Medical History:  Diagnosis Date   GERD (gastroesophageal reflux disease)    HLD (hyperlipidemia)    TIA (transient ischemic attack)    ? TIA    Social History:  Social History   Socioeconomic History   Marital status: Married    Spouse name: Not on file   Number of children: Not on file   Years of education: Not on file   Highest education level: Not on file  Occupational History   Not on file  Tobacco Use   Smoking status: Never   Smokeless tobacco: Never   Tobacco comments:    maybe a cigar on occasion  Vaping Use   Vaping Use: Never used  Substance and Sexual Activity   Alcohol use: Yes    Comment: 4 days a week   Drug use: No   Sexual activity: Not on file  Other Topics Concern   Not on file  Social History Narrative   Not on file   Social Determinants of Health   Financial Resource Strain: Not on file  Food Insecurity: Not on file  Transportation Needs: Not on file  Physical Activity: Not on file  Stress: Not on file  Social Connections: Not on file  Intimate Partner Violence: Not on file    Medications:   Current Outpatient Medications on File Prior to Visit  Medication Sig Dispense Refill   Aspirin 81 MG CAPS Take 325 mg by mouth daily.     b complex vitamins capsule Take 1 capsule by mouth daily.     rosuvastatin (CRESTOR) 10 MG tablet TAKE ONE TABLET BY MOUTH DAILY 90 tablet 2   valACYclovir (VALTREX) 500 MG tablet Take 500 mg by mouth daily as needed.     Current Facility-Administered Medications on File Prior to Visit  Medication Dose Route Frequency Provider Last Rate Last Admin   0.9 %  sodium chloride infusion  500 mL Intravenous Continuous Danis, Estill Cotta III, MD       0.9 %  sodium chloride infusion  500 mL Intravenous Once Nelida Meuse III, MD        Allergies:  No Known Allergies  Physical  Exam General: well developed, well nourished pleasant Caucasian male, seated, in no evident distress Head: head normocephalic and atraumatic.   Neck: supple with no carotid or supraclavicular bruits Cardiovascular: regular rate and rhythm, no murmurs Musculoskeletal: no deformity Skin:  no rash/petichiae Vascular:  Normal pulses all extremities  Neurologic Exam Mental Status: Awake and fully alert. Oriented to place and time. Recent and remote memory intact. Attention span, concentration and fund of knowledge appropriate. Mood and affect appropriate.  Mini-Mental status exam not done.  Last visit was 30/30.Marland Kitchen  Able to name 21 animals which can walk on 4 legs.  Clock drawing 4/4.   Cranial  Nerves: Fundoscopic exam not done. Pupils equal, briskly reactive to light. Extraocular movements full without nystagmus. Visual fields full to confrontation. Hearing diminished bilaterally.  Facial sensation intact. Face, tongue, palate moves normally and symmetrically.  Motor: Normal bulk and tone. Normal strength in all tested extremity muscles. Sensory.: intact to touch , pinprick , position and vibratory sensation.  Coordination: Rapid alternating movements normal in all extremities. Finger-to-nose and heel-to-shin performed accurately bilaterally. Gait and Station: Arises from chair without difficulty. Stance is normal. Gait demonstrates normal stride length and balance . Able to heel, toe and tandem walk without difficulty.  Reflexes: 1+ and symmetric. Toes downgoing.      ASSESSMENT: 71 year old Caucasian male with recurrent transient stereotypical episodes of expressive language difficulties of unclear etiology.  Left hemispheric TIAs will be unusual given unremarkable neurovascular studies.  Complicated migraine versus seizures is also a possibility.  Beginning of primary progressive aphasia like neurodegenerative disorder is less likely given his excellent cognitive status     PLANI had a long  discussion with the patient regarding his 3 transient episode of speech difficulties over the last 4 years which were transient and remain of unclear etiology with negative neurovascular work-up as well as EEG studies.  He does have mild cognitive impairment which appears to be age appropriate.  I recommend he increase participation in cognitively challenging activities like solving crossword puzzles, playing bridge, sudoku or word searches.  We also discussed memory compensation strategies.  I do not believe he needs any specific medications for this at the present time.  Patient also appears quite anxious about coronary artery disease as a close friend of his died suddenly.  I referred he see cardiologist Dr. Karma Ganja on his seen in the past and discussed evaluation if needed.  He will return for follow-up in the future to see me in 6 months or call earlier if necessary. He will stay on aspirin for stroke prevention and maintain aggressive control of lipids with LDL goal below 70 mg percent and hypertension blood pressure goal below 130/90 and diabetes with hemoglobin A1c goal below 6.5%.  He will return for follow-up in 4 months or call earlier if necessary.  Greater than 50% time during this 35-minute visit was spent in counseling and coordination of care about his episodes of transient speech difficulties and discussing differential diagnosis and evaluation plan and answering questions  Antony Contras, MD Note: This document was prepared with digital dictation and possible smart phrase technology. Any transcriptional errors that result from this process are unintentional.

## 2022-02-21 NOTE — Patient Instructions (Signed)
I had a long discussion with the patient regarding his 3 transient episode of speech difficulties over the last 4 years which were transient and remain of unclear etiology with negative neurovascular work-up as well as EEG studies.  He does have mild cognitive impairment which appears to be age appropriate.  I recommend he increase participation in cognitively challenging activities like solving crossword puzzles, playing bridge, sudoku or word searches.  We also discussed memory compensation strategies.  I do not believe he needs any specific medications for this at the present time.  Patient also appears quite anxious about coronary artery disease as a close friend of his died suddenly.  I referred he see cardiologist Dr. Tarri Glenn on his seen in the past and discussed evaluation if needed.  He will return for follow-up in the future to see me in 6 months or call earlier if necessary. Memory Compensation Strategies  Use "WARM" strategy.  W= write it down  A= associate it  R= repeat it  M= make a mental note  2.   You can keep a Social worker.  Use a 3-ring notebook with sections for the following: calendar, important names and phone numbers,  medications, doctors' names/phone numbers, lists/reminders, and a section to journal what you did  each day.   3.    Use a calendar to write appointments down.  4.    Write yourself a schedule for the day.  This can be placed on the calendar or in a separate section of the Memory Notebook.  Keeping a  regular schedule can help memory.  5.    Use medication organizer with sections for each day or morning/evening pills.  You may need help loading it  6.    Keep a basket, or pegboard by the door.  Place items that you need to take out with you in the basket or on the pegboard.  You may also want to  include a message board for reminders.  7.    Use sticky notes.  Place sticky notes with reminders in a place where the task is performed.  For example: " turn  off the  stove" placed by the stove, "lock the door" placed on the door at eye level, " take your medications" on  the bathroom mirror or by the place where you normally take your medications.  8.    Use alarms/timers.  Use while cooking to remind yourself to check on food or as a reminder to take your medicine, or as a  reminder to make a call, or as a reminder to perform another task, etc.

## 2022-03-20 ENCOUNTER — Ambulatory Visit: Payer: Medicare Other | Admitting: Neurology

## 2022-03-22 ENCOUNTER — Ambulatory Visit: Payer: Managed Care, Other (non HMO) | Admitting: Cardiovascular Disease

## 2022-03-28 ENCOUNTER — Encounter: Payer: Self-pay | Admitting: Cardiovascular Disease

## 2022-03-28 NOTE — Progress Notes (Signed)
Cardiology Office Note:    Date:  03/29/2022   ID:  [name], DOB December 18, 1950, MRN [id number]  PCP:  Sueanne Margarita, DO  Cardiologist:  Mertie Moores, MD  Electrophysiologist:  None   Referring MD: Sueanne Margarita, DO   Chief Complaint  Patient presents with   Transient Ischemic Attack     December 07, 2018    ZYMIR NAPOLI is a 71 y.o. male with a recent  hx of a TIA. He is self referred ( friend and neighbor of Bea Laura, [place])   [name] had an episode of inability to speak correctly  1 week ago The symptoms lasted 15 minutes - 30 min  and have not returned No weakness or numbness A few min of blurry vision No Cp , no dyspnea.    Lipids are elevated.   Checks his BP at the [facility] ,  Is occasionally elevated  Does not eat much salt  On no meds   Swam his 1.25 miles the next day without any issues   CT of the head was negative.  Carotid duplex revealed mild plaque.  [place], , heating oil and propane .     Went to [employer].  Swam  stuideied busienss   April 06, 2019   [name] is seen today for follow up of his TIA  Monitor showed NSR Echo shows nomal LV function.  Mild Ao dilitation    March 29, 2022 [name] is seen for follow up of a TIA I last saw him int 2020 Event monitor showed NSR.   Echo shows normal LV function, mild aortic dilatation  [name] has an episodes of aphasia in Jan. 2023 He went to the ER but within 15 min, his symptoms resolved.  MRA of the neck shoed a 50 vertebral stensis  He was seen by Dr. Leonie Man.      [name] is concerned about a friend who recently died of CAD and wanted to be evaluated   Very active.  No CP   Past Medical History:  Diagnosis Date   GERD (gastroesophageal reflux disease)    HLD (hyperlipidemia)    TIA (transient ischemic attack)    ? TIA    Past Surgical History:  Procedure Laterality Date   CERVICAL SPINE SURGERY     COLONOSCOPY     ESOPHAGOGASTRODUODENOSCOPY ENDOSCOPY     TONSILLECTOMY      Current  Medications: Current Meds  Medication Sig   b complex vitamins capsule Take 1 capsule by mouth daily.   rosuvastatin (CRESTOR) 10 MG tablet TAKE ONE TABLET BY MOUTH DAILY   valACYclovir (VALTREX) 500 MG tablet Take 500 mg by mouth daily as needed.   Current Facility-Administered Medications for the 03/29/22 encounter (Office Visit) with Voncille Simm, Wonda Cheng, MD  Medication   0.9 %  sodium chloride infusion   0.9 %  sodium chloride infusion     Allergies:   Patient has no known allergies.   Social History   Socioeconomic History   Marital status: Married    Spouse name: Not on file   Number of children: Not on file   Years of education: Not on file   Highest education level: Not on file  Occupational History   Not on file  Tobacco Use   Smoking status: Never   Smokeless tobacco: Never   Tobacco comments:    maybe a cigar on occasion  Vaping Use   Vaping Use: Never used  Substance and Sexual Activity  Alcohol use: Yes    Comment: 4 days a week   Drug use: No   Sexual activity: Not on file  Other Topics Concern   Not on file  Social History Narrative   Not on file   Social Determinants of Health   Financial Resource Strain: Not on file  Food Insecurity: Not on file  Transportation Needs: Not on file  Physical Activity: Not on file  Stress: Not on file  Social Connections: Not on file     Family History: The patient's family history includes Lung cancer in his father. There is no history of Colon cancer, Esophageal cancer, Stomach cancer, or Rectal cancer.  ROS:   Please see the history of present illness.     All other systems reviewed and are negative.  EKGs/Labs/Other Studies Reviewed:    The following studies were reviewed today:   EKG:   Recent Labs: 10/11/2021: ALT 28; BUN 25; Creatinine, Ser 1.26; Hemoglobin 14.6; Platelets 122; Potassium 3.9; Sodium 136 10/29/2021: TSH 2.040  Recent Lipid Panel No results found for: "CHOL", "TRIG", "HDL", "CHOLHDL",  "VLDL", "LDLCALC", "LDLDIRECT"   Physical Exam: Blood pressure 124/72, pulse (!) 46, height 6' (1.829 m), weight 198 lb (89.8 kg), SpO2 98 %.  GEN:  Well nourished, well developed in no acute distress HEENT: Normal NECK: No JVD; No carotid bruits LYMPHATICS: No lymphadenopathy CARDIAC: RRR , no murmurs, rubs, gallops RESPIRATORY:  Clear to auscultation without rales, wheezing or rhonchi  ABDOMEN: Soft, non-tender, non-distended MUSCULOSKELETAL:  No edema; No deformity  SKIN: Warm and dry NEUROLOGIC:  Alert and oriented x 3    ASSESSMENT:    1. Mixed hyperlipidemia    PLAN:       TIA - Tom  had a TIA several months ago .   Echo is unremarkable except for mild dilitation of his ascending ao. Even monitor looks good    2. Hyperlipidemia :   will get a CAC score  We discussed this in great detail   3.  Remote hx of smoking:   we discussed CT scan.  I'll see if he is a candidate for low dose screening lung CT scan .   Will see in a year   Medication Adjustments/Labs and Tests Ordered: Current medicines are reviewed at length with the patient today.  Concerns regarding medicines are outlined above.  Orders Placed This Encounter  Procedures   CT CARDIAC SCORING (SELF PAY ONLY)   Lipid panel   No orders of the defined types were placed in this encounter.   Patient Instructions  Medication Instructions:  Your physician recommends that you continue on your current medications as directed. Please refer to the Current Medication list given to you today.  *If you need a refill on your cardiac medications before your next appointment, please call your pharmacy*   Lab Work: LIPIDS If you have labs (blood work) drawn today and your tests are completely normal, you will receive your results only by: [place] (if you have MyChart) OR A paper copy in the mail If you have any lab test that is abnormal or we need to change your treatment, we will call you to review the  results.  Testing/Procedures: Coronary CT Calcium score Your physician has requested that you have cardiac CT. Cardiac computed tomography (CT) is a painless test that uses an x-ray machine to take clear, detailed pictures of your heart. For further information please visit [facility]uge[facility]iest[facility]tn. Please follow instruction sheet as given.  Follow-Up: At  [facility], you and your health needs are our priority.  As part of our continuing mission to provide you with exceptional heart care, we have created designated Provider Care Teams.  These Care Teams include your primary Cardiologist (physician) and Advanced Practice Providers (APPs -  Physician Assistants and Nurse Practitioners) who all work together to provide you with the care you need, when you need it.  Your next appointment:   1 year(s)  The format for your next appointment:   In Person  Provider:   Ronn Melena, or Sharea Guinther  Important Information About Sugar         Signed, Mertie Moores, MD  03/29/2022 8:44 AM    [place]

## 2022-03-29 ENCOUNTER — Ambulatory Visit: Payer: Managed Care, Other (non HMO) | Admitting: Cardiovascular Disease

## 2022-03-29 ENCOUNTER — Encounter: Payer: Self-pay | Admitting: Cardiovascular Disease

## 2022-03-29 VITALS — BP 124/72 | HR 46 | Ht 72.0 in | Wt 198.0 lb

## 2022-03-29 DIAGNOSIS — E782 Mixed hyperlipidemia: Secondary | ICD-10-CM | POA: Diagnosis not present

## 2022-03-29 LAB — LIPID PANEL
Chol/HDL Ratio: 4.1 ratio (ref 0.0–5.0)
Cholesterol, Total: 207 mg/dL — ABNORMAL HIGH (ref 100–199)
HDL: 50 mg/dL (ref >39)
LDL Chol Calc (NIH): 142 mg/dL — ABNORMAL HIGH (ref 0–99)
Triglycerides: 84 mg/dL (ref 0–149)
VLDL Cholesterol Cal: 15 mg/dL (ref 5–40)

## 2022-03-29 LAB — ALT: ALT: 22 IU/L (ref 0–44)

## 2022-03-29 LAB — BASIC METABOLIC PANEL
BUN/Creatinine Ratio: 19 (ref 10–24)
BUN: 19 mg/dL (ref 8–27)
CO2: 23 mmol/L (ref 20–29)
Calcium: 10.6 mg/dL — ABNORMAL HIGH (ref 8.6–10.2)
Chloride: 103 mmol/L (ref 96–106)
Creatinine, Ser: 1.01 mg/dL (ref 0.76–1.27)
Glucose: 94 mg/dL (ref 70–99)
Potassium: 4.4 mmol/L (ref 3.5–5.2)
Sodium: 139 mmol/L (ref 134–144)
eGFR: 81 mL/min/{1.73_m2} (ref >59)

## 2022-03-29 NOTE — Patient Instructions (Signed)
Medication Instructions:  Your physician recommends that you continue on your current medications as directed. Please refer to the Current Medication list given to you today.  *If you need a refill on your cardiac medications before your next appointment, please call your pharmacy*   Lab Work: LIPIDS If you have labs (blood work) drawn today and your tests are completely normal, you will receive your results only by: [place] (if you have MyChart) OR A paper copy in the mail If you have any lab test that is abnormal or we need to change your treatment, we will call you to review the results.  Testing/Procedures: Coronary CT Calcium score Your physician has requested that you have cardiac CT. Cardiac computed tomography (CT) is a painless test that uses an x-ray machine to take clear, detailed pictures of your heart. For further information please visit [facility]tn. Please follow instruction sheet as given.  Follow-Up: At [facility], you and your health needs are our priority.  As part of our continuing mission to provide you with exceptional heart care, we have created designated Provider Care Teams.  These Care Teams include your primary Cardiologist (physician) and Advanced Practice Providers (APPs -  Physician Assistants and Nurse Practitioners) who all work together to provide you with the care you need, when you need it.  Your next appointment:   1 year(s)  The format for your next appointment:   In Person  Provider:   Ronn Melena, or [place]  Important Information About Sugar

## 2022-04-04 ENCOUNTER — Encounter: Payer: Self-pay | Admitting: Cardiovascular Disease

## 2022-04-04 NOTE — Telephone Encounter (Signed)
Returned call to patient to discuss hypercalcemia on 03/29/22 labs done here. Educated patient that 10.6 was high, but normal ranges can be 9-11. Questioned pt regarding recent dietary changes, he denies any increase in calcium-rich foods. Does attest to starting a new diet regimen called Optivia bars. I've looked them up online and no single bar has greater than 15% daily allowance of calcium, so likely not contributing to the change. Educated patient that his labs done at [facility] on 10/16/21 showed elevated parathyroid hormone level of 78 and Calcium level at that time was also 10.6, so elevated, but unchanged since then. Advised the patient to follow with his PCP which he has scheduled visit this November, and request repeat labs on PTH and Ca. Educated patient on the role of the parathyroid hormone in calcium regulation. Patient also denies using antacids routinely, and no new multivitamins/supplements.

## 2022-04-17 ENCOUNTER — Ambulatory Visit (HOSPITAL_BASED_OUTPATIENT_CLINIC_OR_DEPARTMENT_OTHER)
Admission: RE | Admit: 2022-04-17 | Discharge: 2022-04-17 | Disposition: A | Discharge status: Home / Self Care | Payer: Managed Care, Other (non HMO) | Source: Ambulatory Visit | Attending: Cardiovascular Disease | Admitting: Cardiovascular Disease

## 2022-04-17 ENCOUNTER — Encounter (HOSPITAL_BASED_OUTPATIENT_CLINIC_OR_DEPARTMENT_OTHER): Payer: Self-pay

## 2022-04-17 DIAGNOSIS — E782 Mixed hyperlipidemia: Secondary | ICD-10-CM | POA: Insufficient documentation

## 2022-04-21 ENCOUNTER — Encounter: Payer: Self-pay | Admitting: Cardiovascular Disease

## 2022-04-26 ENCOUNTER — Telehealth: Payer: Self-pay

## 2022-04-26 DIAGNOSIS — E782 Mixed hyperlipidemia: Secondary | ICD-10-CM

## 2022-04-26 NOTE — Telephone Encounter (Signed)
-----   Message from Thayer Headings, MD sent at 04/21/2022  7:46 AM EDT ----- Coronary calcium score of 154. This was 32 percentile for age-, race-, and sex-matched controls He has a family hx of CAD LDL is 142 ( on Rosuvastatin) Please refer to Lipid clinic for consideration of PSK9 inhibitor or Inclisiran He has not been having any cardiac symptoms.   Will ask him to let us know if he has any exertional CP or chest tightness

## 2022-04-26 NOTE — Telephone Encounter (Signed)
Left detailed message per DPR. Referral order placed for lipid clinic at this time.

## 2022-04-29 ENCOUNTER — Ambulatory Visit: Payer: Medicare Other | Admitting: Neurology

## 2022-05-24 ENCOUNTER — Ambulatory Visit: Payer: Managed Care, Other (non HMO)

## 2022-05-24 NOTE — Progress Notes (Deleted)
Patient ID: [name]                 DOB: 04/17/1951                    MRN: [id number]      HPI: Mike Arias is a 71 y.o. male patient referred to lipid clinic by Nahser. PMH is significant for 2 possible TIAs, mild carotid artery disease, HLD and CAC 154, 54th percentile. Patient is currently on rousvastatin '10mg'$  daily. LDL-C 142 on 03/29/22.  [employer] insurance Even been on higher doses of rosuvastatin? [id number]  Current Medications:  Intolerances:  Risk Factors: 2 TIAs LDL goal: with 2 possible TIAs- will target <55 ApoB goal: <70  Diet:   Exercise:   Family History:   Social History:   Labs: 03/29/22 TC 207, TG 84, HDL 50, LDL-C 142, non HDL 92  Past Medical History:  Diagnosis Date   GERD (gastroesophageal reflux disease)    HLD (hyperlipidemia)    TIA (transient ischemic attack)    ? TIA    Current Outpatient Medications on File Prior to Visit  Medication Sig Dispense Refill   Aspirin 81 MG CAPS Take 325 mg by mouth daily. (Patient not taking: Reported on 03/29/2022)     b complex vitamins capsule Take 1 capsule by mouth daily.     rosuvastatin (CRESTOR) 10 MG tablet TAKE ONE TABLET BY MOUTH DAILY 90 tablet 2   tadalafil (CIALIS) 10 MG tablet Take 10 mg by mouth daily as needed. For erectile dysfunction 30-45 minutes prior to sexual activity (Patient not taking: Reported on 03/29/2022)     valACYclovir (VALTREX) 500 MG tablet Take 500 mg by mouth daily as needed.     Current Facility-Administered Medications on File Prior to Visit  Medication Dose Route Frequency Provider Last Rate Last Admin   0.9 %  sodium chloride infusion  500 mL Intravenous Continuous Danis, Estill Cotta III, MD       0.9 %  sodium chloride infusion  500 mL Intravenous Once Danis, Estill Cotta III, MD        No Known Allergies  Assessment/Plan:  1. Hyperlipidemia -    Thank you,   Ramond Dial, Pharm.D, BCPS, CPP [facility]  [address] [place], [place] [address]   Phone: [phone]; Fax: [phone]

## 2022-06-17 DIAGNOSIS — E785 Hyperlipidemia, unspecified: Secondary | ICD-10-CM | POA: Diagnosis not present

## 2022-06-17 DIAGNOSIS — Z125 Encounter for screening for malignant neoplasm of prostate: Secondary | ICD-10-CM | POA: Diagnosis not present

## 2022-06-17 DIAGNOSIS — R7989 Other specified abnormal findings of blood chemistry: Secondary | ICD-10-CM | POA: Diagnosis not present

## 2022-06-21 DIAGNOSIS — Z23 Encounter for immunization: Secondary | ICD-10-CM | POA: Diagnosis not present

## 2022-06-21 DIAGNOSIS — R82998 Other abnormal findings in urine: Secondary | ICD-10-CM | POA: Diagnosis not present

## 2022-06-26 DIAGNOSIS — D692 Other nonthrombocytopenic purpura: Secondary | ICD-10-CM | POA: Diagnosis not present

## 2022-06-26 DIAGNOSIS — B009 Herpesviral infection, unspecified: Secondary | ICD-10-CM | POA: Diagnosis not present

## 2022-06-26 DIAGNOSIS — Z0000 Encounter for general adult medical examination without abnormal findings: Secondary | ICD-10-CM | POA: Diagnosis not present

## 2022-06-26 DIAGNOSIS — L609 Nail disorder, unspecified: Secondary | ICD-10-CM | POA: Diagnosis not present

## 2022-07-26 ENCOUNTER — Ambulatory Visit: Payer: Medicare Other | Attending: Cardiovascular Disease | Admitting: Pharmacist

## 2022-07-26 DIAGNOSIS — G459 Transient cerebral ischemic attack, unspecified: Secondary | ICD-10-CM

## 2022-07-26 NOTE — Progress Notes (Unsigned)
Patient ID: Mike Arias                 DOB: 10/14/1950                    MRN: [id number]      HPI: Mike Arias is a 71 y.o. male patient referred to lipid clinic by Dr. Acie Fredrickson. PMH is significant for TIA (2020/ Jan 2023), GERD, HLD. Patient recently received a coronary calcium score of 154, which is 54th percentile for age, race, and sex matched controls. Last LDL-C was 142 while on rosuvastatin. Was increased to rosuvastatin '20mg'$  in June 2023, but patient states he started higher dose 6 weeks ago. Patient is not adherent with medication at home and admits to taking about 3x weekly, but most adherent with the statin medication.  Patient being referred to clinic to discuss other options for lipid management and lifestyle modifications  Current Lipid Medications: rosuvastatin '20mg'$  Intolerances: None documented  Risk Factors: previous hx of smoking, hx of TIA,  LDL goal: <70  Diet: -Patient is aware of what can affect his cholesterol and overall health. Trying to incorporate more vegetables, not a fan of bread, and tries to eat more fish and chicken into diet, and eat red meat in moderation.   Exercise:  -patient is an active hunter and gets a lot of mileage during hunting season. Patient was a Academic librarian in college and still enjoys finding a pool to swim in for his cardiovascular health. Does not go many days without having to be active.  Family History:  Family History  Problem Relation Age of Onset   Lung cancer Father    Colon cancer Neg Hx    Esophageal cancer Neg Hx    Stomach cancer Neg Hx    Rectal cancer Neg Hx      Social History: Cigars on occasion, 4 drinks/week (social drinker-bourbon); does not drink at home  Labs: 03/29/2022: TC 207, HDL 50, TG 84, LDL-C 142 (not taking rosuvastatin consistently) 06/18/2022: TC 168, HDL 60, TG 68, LDL-C 94 ([facility])  Past Medical History:  Diagnosis Date   GERD (gastroesophageal reflux disease)    HLD (hyperlipidemia)     TIA (transient ischemic attack)    ? TIA    Current Outpatient Medications on File Prior to Visit  Medication Sig Dispense Refill   Aspirin 81 MG CAPS Take 325 mg by mouth daily. (Patient not taking: Reported on 03/29/2022)     b complex vitamins capsule Take 1 capsule by mouth daily.     rosuvastatin (CRESTOR) 10 MG tablet TAKE ONE TABLET BY MOUTH DAILY 90 tablet 2   tadalafil (CIALIS) 10 MG tablet Take 10 mg by mouth daily as needed. For erectile dysfunction 30-45 minutes prior to sexual activity (Patient not taking: Reported on 03/29/2022)     valACYclovir (VALTREX) 500 MG tablet Take 500 mg by mouth daily as needed.     Current Facility-Administered Medications on File Prior to Visit  Medication Dose Route Frequency Provider Last Rate Last Admin   0.9 %  sodium chloride infusion  500 mL Intravenous Continuous Danis, Estill Cotta III, MD       0.9 %  sodium chloride infusion  500 mL Intravenous Once Danis, Estill Cotta III, MD        No Known Allergies  Assessment/Plan:  1. Hyperlipidemia - Assessment: Patient is still above his LDL-C goal (94) despite treatment with Crestor '20mg'$ . Patient admits to not being  adherent with his medications and takes crestor only 3x/week. He is taking the '20mg'$  which was recently increased about 6 weeks ago by Dr. Francesco Sor. Patient has a very active lifestyle, is aware of diet changes he can make, and a social drinker (3 bourbons). No complaints of side effects from medications or cost barriers. Plan:  Recommended taking Crestor '20mg'$  daily to continue to improve lipid panel.  Discussed adding taking his medications to his morning routine to be more compliant Recommended limiting carbohydrates and red meats in diet and to continue eating more foods like vegetables and lean meats in diet. Follow up: 10/21/2021 for fasting lipid panel   Thank you,  Sandford Craze, PharmD. [facility] PGY-1 07/26/2022 9:18 AM   Ramond Dial, Pharm.D, BCPS, CPP [facility]  [address], [place], [place] [address]  Phone: [phone]; Fax: [phone]

## 2022-07-26 NOTE — Patient Instructions (Signed)
Try to take your rosuvastatin regularly Continue being active. Consider adding in some strength training Please come for fasting blood work on 1/29. The lab opens at 7:15 and you can come anytime after that.

## 2022-08-06 ENCOUNTER — Telehealth: Payer: Self-pay | Admitting: Neurology

## 2022-08-06 NOTE — Telephone Encounter (Signed)
LVM and sent mychart msg informing pt of need to reschedule 12/14 appointment - MD out

## 2022-08-08 DIAGNOSIS — M1712 Unilateral primary osteoarthritis, left knee: Secondary | ICD-10-CM | POA: Diagnosis not present

## 2022-08-08 DIAGNOSIS — M170 Bilateral primary osteoarthritis of knee: Secondary | ICD-10-CM | POA: Diagnosis not present

## 2022-08-08 DIAGNOSIS — M1611 Unilateral primary osteoarthritis, right hip: Secondary | ICD-10-CM | POA: Diagnosis not present

## 2022-08-08 DIAGNOSIS — M1711 Unilateral primary osteoarthritis, right knee: Secondary | ICD-10-CM | POA: Diagnosis not present

## 2022-09-05 ENCOUNTER — Ambulatory Visit: Payer: Medicare Other | Admitting: Neurology

## 2022-10-14 ENCOUNTER — Telehealth: Payer: Self-pay | Admitting: Neurology

## 2022-10-15 NOTE — Telephone Encounter (Signed)
Error

## 2022-10-16 ENCOUNTER — Ambulatory Visit: Payer: Medicare Other | Admitting: Neurology

## 2022-10-21 ENCOUNTER — Ambulatory Visit: Payer: Medicare Other

## 2022-11-05 ENCOUNTER — Ambulatory Visit: Payer: Medicare Other | Attending: Cardiovascular Disease

## 2022-11-05 ENCOUNTER — Telehealth: Payer: Self-pay | Admitting: Cardiovascular Disease

## 2022-11-05 DIAGNOSIS — E782 Mixed hyperlipidemia: Secondary | ICD-10-CM

## 2022-11-05 LAB — LIPID PANEL
Chol/HDL Ratio: 3.1 ratio (ref 0.0–5.0)
Cholesterol, Total: 144 mg/dL (ref 100–199)
HDL: 46 mg/dL (ref >39)
LDL Chol Calc (NIH): 83 mg/dL (ref 0–99)
Triglycerides: 78 mg/dL (ref 0–149)
VLDL Cholesterol Cal: 15 mg/dL (ref 5–40)

## 2022-11-05 NOTE — Addendum Note (Signed)
Addended by: Ma Hillock on: 11/05/2022 08:42 AM   Modules accepted: Orders

## 2022-11-05 NOTE — Telephone Encounter (Signed)
Pt arrived for labs this morning and nothing scheduled. Looking through chart shows pt saw lipid clinic November 2023:  Recommended taking Crestor 37m daily to continue to improve lipid panel.  Discussed adding taking his medications to his morning routine to be more compliant Recommended limiting carbohydrates and red meats in diet and to continue eating more foods like vegetables and lean meats in diet. Follow up: 10/21/2021 for fasting lipid panel  Order placed so that lipid panel may be drawn for patient as he did not come in January for this, but showed this morning.

## 2022-12-04 DIAGNOSIS — H43811 Vitreous degeneration, right eye: Secondary | ICD-10-CM | POA: Diagnosis not present

## 2022-12-04 DIAGNOSIS — H35371 Puckering of macula, right eye: Secondary | ICD-10-CM | POA: Diagnosis not present

## 2022-12-04 DIAGNOSIS — H524 Presbyopia: Secondary | ICD-10-CM | POA: Diagnosis not present

## 2022-12-04 DIAGNOSIS — H2513 Age-related nuclear cataract, bilateral: Secondary | ICD-10-CM | POA: Diagnosis not present

## 2022-12-04 DIAGNOSIS — H31001 Unspecified chorioretinal scars, right eye: Secondary | ICD-10-CM | POA: Diagnosis not present

## 2022-12-05 DIAGNOSIS — K080 Exfoliation of teeth due to systemic causes: Secondary | ICD-10-CM | POA: Diagnosis not present

## 2022-12-24 DIAGNOSIS — M170 Bilateral primary osteoarthritis of knee: Secondary | ICD-10-CM | POA: Diagnosis not present

## 2022-12-31 DIAGNOSIS — M170 Bilateral primary osteoarthritis of knee: Secondary | ICD-10-CM | POA: Diagnosis not present

## 2023-01-14 DIAGNOSIS — M170 Bilateral primary osteoarthritis of knee: Secondary | ICD-10-CM | POA: Diagnosis not present

## 2023-01-28 NOTE — Patient Instructions (Incomplete)
Below is our plan:  We will continue to monitor. Continue asa and rosuvastatin per PCP direction for stroke prevention.   Please make sure you are staying well hydrated. I recommend 50-60 ounces daily. Well balanced diet and regular exercise encouraged. Consistent sleep schedule with 6-8 hours recommended.   Please continue follow up with care team as directed.   Follow up with me as needed   You may receive a survey regarding today's visit. I encourage you to leave honest feed back as I do use this information to improve patient care. Thank you for seeing me today!

## 2023-01-28 NOTE — Progress Notes (Unsigned)
No chief complaint on file.   HISTORY OF PRESENT ILLNESS:  01/28/23 ALL:  Mike Arias is a 72 y.o. male here today for follow up for  MCI. He was last seen by Dr Pearlean Brownie 02/2022 and doing well. MMSE 30/30. He was encouraged to continue memory compensation strategies. Since,    HISTORY (copied from Dr Marlis Edelson previous note)   HPI: Initial visit 10/29/2021 :Mike Arias is a 72 year old pleasant Caucasian male seen today for initial office consultation visit for episode of expressive aphasia.  History is obtained from the patient, review of electronic medical records I have personally reviewed pertinent available imaging films in PACS.  He has past medical history of hyperlipidemia and gastroesophageal reflux disease.  Presented to the ER on 10/11/2021 with sudden onset of difficulty speaking and get getting his words out.  He states he had gone outside and picked up some pizza for dinner and is came home and tried to talk to his wife he knew what he wanted to say but the words did not come out.  He could hear what his wife was saying but he had trouble applying to her.  He was fully aware of his surroundings and knew what happened.  His wife insisted he go to the ER and by the time he reached the ER in about 10 to 15 minutes he was able to speak to the registration desk person and was quickly back to his baseline.  He denied any accompanying headache, blurred vision, numbness, tingling, extremity weakness gait or balance problem accompanying this speech difficulty.  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  Patient had a similar episode previously in April 2020 at that time he had an MRI scan of the brain on 01/05/2019 which showed only minor changes of chronic small vessel disease no acute abnormality.  MR angiogram of the brain at that time was normal.  Carotid ultrasound  had shown only minor none significant plaque bilaterally.  He may have had some confusion during this episode as he could not remember colleagues name.  He had a third episode possibly in May after 2018 which is listed in the ER note has altered mental status and an MRI at that time also was fairly unremarkable except for changes of small vessel disease.  Patient denies headache accompanying all of these episodes and denies history of significant migraines.  He has no prior history of seizures.  He is fully aware during these episodes of his surroundings does not lose consciousness.  Denies episodes of syncope or palpitations or loss of consciousness.  He has not had an EEG yet.  He denies any prior history of significant head injury with loss of consciousness, childhood febrile seizures, meningitis or encephalitis.  Patient was seen by Dr.Ahern in our office in  2020 but has been referred back to me for my opinion this time  Update 02/21/2022 ; he returns for follow-up after last visit 4 months ago.  Patient states he is doing well and is no further episodes of speech disturbance.  He has had a total of 3 episodes over 4 years but none since last visit.  He had lab work on 10/29/2021 and vitamin B12, TSH, homocystine and test for syphilis were all normal.  EEG on 11/15/2021 was also normal.  MRI scan of the brain on 12/27/2021 showed mild changes of  generalized atrophy and white matter changes which are age-appropriate.  MR angiogram of the brain was normal.  White matter changes and atrophy is slightly progressed compared to MRI from 01/05/2019.  Patient remains on aspirin which is tolerating well with only minor bruising but no bleeding.  He remains on Crestor tolerating well without muscle aches and pains.  His blood pressure is well controlled today it is 135/84.  He feels he has mild subjective memory difficulties but on testing in the office today his recall was 3/3 and was able to name 21 animals which can walk on  4 legs and clock drawing was 4/4.   REVIEW OF SYSTEMS: Out of a complete 14 system review of symptoms, the patient complains only of the following symptoms, and all other reviewed systems are negative.   ALLERGIES: No Known Allergies   HOME MEDICATIONS: Outpatient Medications Prior to Visit  Medication Sig Dispense Refill   Aspirin 81 MG CAPS Take 325 mg by mouth daily. (Patient not taking: Reported on 03/29/2022)     b complex vitamins capsule Take 1 capsule by mouth daily.     rosuvastatin (CRESTOR) 10 MG tablet TAKE ONE TABLET BY MOUTH DAILY 90 tablet 2   tadalafil (CIALIS) 10 MG tablet Take 10 mg by mouth daily as needed. For erectile dysfunction 30-45 minutes prior to sexual activity (Patient not taking: Reported on 03/29/2022)     valACYclovir (VALTREX) 500 MG tablet Take 500 mg by mouth daily as needed.     Facility-Administered Medications Prior to Visit  Medication Dose Route Frequency Provider Last Rate Last Admin   0.9 %  sodium chloride infusion  500 mL Intravenous Continuous Danis, Starr [place] III, MD       0.9 %  sodium chloride infusion  500 mL Intravenous Once Danis, Andreas Blower, MD         PAST MEDICAL HISTORY: Past Medical History:  Diagnosis Date   GERD (gastroesophageal reflux disease)    HLD (hyperlipidemia)    TIA (transient ischemic attack)    ? TIA     PAST SURGICAL HISTORY: Past Surgical History:  Procedure Laterality Date   CERVICAL SPINE SURGERY     COLONOSCOPY     ESOPHAGOGASTRODUODENOSCOPY ENDOSCOPY     TONSILLECTOMY       FAMILY HISTORY: Family History  Problem Relation Age of Onset   Lung cancer Father    Colon cancer Neg Hx    Esophageal cancer Neg Hx    Stomach cancer Neg Hx    Rectal cancer Neg Hx      SOCIAL HISTORY: Social History   Socioeconomic History   Marital status: Married    Spouse name: Not on file   Number of children: Not on file   Years of education: Not on file   Highest education level: Not on file   Occupational History   Not on file  Tobacco Use   Smoking status: Never   Smokeless tobacco: Never   Tobacco comments:    maybe a cigar on occasion  Vaping Use   Vaping Use: Never used  Substance and Sexual Activity   Alcohol use: Yes    Comment: 4 days a week   Drug use: No   Sexual activity: Not on file  Other Topics Concern   Not on file  Social History Narrative   Not on file   Social Determinants of Health   Financial Resource Strain: Not on file  Food Insecurity: Not on file  Transportation Needs: Not on file  Physical Activity: Not on file  Stress: Not on file  Social Connections: Not on file  Intimate Partner Violence: Not on file     PHYSICAL EXAM  There were no vitals filed for this visit. There is no height or weight on file to calculate BMI.  Generalized: Well developed, in no acute distress  Cardiology: normal rate and rhythm, no murmur auscultated  Respiratory: clear to auscultation bilaterally    Neurological examination  Mentation: Alert oriented to time, place, history taking. Follows all commands speech and language fluent Cranial nerve II-XII: Pupils were equal round reactive to light. Extraocular movements were full, visual field were full on confrontational test. Facial sensation and strength were normal. Uvula tongue midline. Head turning and shoulder shrug  were normal and symmetric. Motor: The motor testing reveals 5 over 5 strength of all 4 extremities. Good symmetric motor tone is noted throughout.  Sensory: Sensory testing is intact to soft touch on all 4 extremities. No evidence of extinction is noted.  Coordination: Cerebellar testing reveals good finger-nose-finger and heel-to-shin bilaterally.  Gait and station: Gait is normal. Tandem gait is normal. Romberg is negative. No drift is seen.  Reflexes: Deep tendon reflexes are symmetric and normal bilaterally.    DIAGNOSTIC DATA (LABS, IMAGING, TESTING) - I reviewed patient records,  labs, notes, testing and imaging myself where available.  Lab Results  Component Value Date   WBC 6.6 10/11/2021   HGB 14.6 10/11/2021   HCT 43.7 10/11/2021   MCV 96.7 10/11/2021   PLT 122 (L) 10/11/2021      Component Value Date/Time   NA 139 03/29/2022 0901   K 4.4 03/29/2022 0901   CL 103 03/29/2022 0901   CO2 23 03/29/2022 0901   GLUCOSE 94 03/29/2022 0901   GLUCOSE 107 (H) 10/11/2021 1909   BUN 19 03/29/2022 0901   CREATININE 1.01 03/29/2022 0901   CALCIUM 10.6 (H) 03/29/2022 0901   PROT 6.4 (L) 10/11/2021 1909   ALBUMIN 4.2 10/11/2021 1909   AST 23 10/11/2021 1909   ALT 22 03/29/2022 0901   ALKPHOS 51 10/11/2021 1909   BILITOT 0.5 10/11/2021 1909   GFRNONAA >60 10/11/2021 1909   GFRAA >60 01/27/2017 1554   Lab Results  Component Value Date   CHOL 144 11/05/2022   HDL 46 11/05/2022   LDLCALC 83 11/05/2022   TRIG 78 11/05/2022   CHOLHDL 3.1 11/05/2022   No results found for: "HGBA1C" Lab Results  Component Value Date   VITAMINB12 322 10/29/2021   Lab Results  Component Value Date   TSH 2.040 10/29/2021       10/29/2021    8:24 AM  MMSE - Mini Mental State Exam  Orientation to time 5  Orientation to Place 5  Registration 3  Attention/ Calculation 5  Recall 3  Language- name 2 objects 2  Language- repeat 1  Language- follow 3 step command 3  Language- read & follow direction 1  Write a sentence 1  Copy design 1  Copy design-comments Four legged animals x 1 min: 24  Total score 30         No data to display           ASSESSMENT AND PLAN  72 y.o. year old male  has a past medical history of GERD (gastroesophageal reflux disease), HLD (hyperlipidemia), and TIA (transient ischemic attack). here with    No diagnosis found.  Romero Belling ***.  Healthy lifestyle habits  encouraged. *** will follow up with PCP as directed. *** will return to see me in ***, sooner if needed. *** verbalizes understanding and agreement with this plan.   No  orders of the defined types were placed in this encounter.    No orders of the defined types were placed in this encounter.    Shawnie Dapper, MSN, FNP-C 01/28/2023, 2:05 PM  [facility] [address], [address] [place], [place] [address] [phone]

## 2023-01-29 ENCOUNTER — Ambulatory Visit: Payer: Medicare Other | Admitting: Family Medicine

## 2023-01-29 ENCOUNTER — Encounter: Payer: Self-pay | Admitting: Family Medicine

## 2023-01-29 VITALS — BP 126/77 | HR 64 | Ht 72.0 in | Wt 197.4 lb

## 2023-01-29 DIAGNOSIS — R4789 Other speech disturbances: Secondary | ICD-10-CM

## 2023-02-11 ENCOUNTER — Encounter: Payer: Self-pay | Admitting: Gastroenterology

## 2023-02-25 ENCOUNTER — Encounter: Payer: Self-pay | Admitting: Gastroenterology

## 2023-03-10 DIAGNOSIS — K080 Exfoliation of teeth due to systemic causes: Secondary | ICD-10-CM | POA: Diagnosis not present

## 2023-03-19 DIAGNOSIS — R5383 Other fatigue: Secondary | ICD-10-CM | POA: Diagnosis not present

## 2023-03-19 DIAGNOSIS — R7989 Other specified abnormal findings of blood chemistry: Secondary | ICD-10-CM | POA: Diagnosis not present

## 2023-03-20 ENCOUNTER — Emergency Department (HOSPITAL_BASED_OUTPATIENT_CLINIC_OR_DEPARTMENT_OTHER)
Admission: EM | Admit: 2023-03-20 | Discharge: 2023-03-20 | Disposition: A | Discharge status: Home / Self Care | Payer: Medicare Other | Attending: Emergency Medicine | Admitting: Emergency Medicine

## 2023-03-20 ENCOUNTER — Encounter (HOSPITAL_BASED_OUTPATIENT_CLINIC_OR_DEPARTMENT_OTHER): Payer: Self-pay

## 2023-03-20 ENCOUNTER — Other Ambulatory Visit: Payer: Self-pay

## 2023-03-20 ENCOUNTER — Emergency Department (HOSPITAL_BASED_OUTPATIENT_CLINIC_OR_DEPARTMENT_OTHER): Payer: Medicare Other | Admitting: Radiology

## 2023-03-20 DIAGNOSIS — R0789 Other chest pain: Secondary | ICD-10-CM | POA: Insufficient documentation

## 2023-03-20 DIAGNOSIS — D72819 Decreased white blood cell count, unspecified: Secondary | ICD-10-CM | POA: Diagnosis not present

## 2023-03-20 DIAGNOSIS — Z8673 Personal history of transient ischemic attack (TIA), and cerebral infarction without residual deficits: Secondary | ICD-10-CM | POA: Diagnosis not present

## 2023-03-20 DIAGNOSIS — R0781 Pleurodynia: Secondary | ICD-10-CM | POA: Diagnosis not present

## 2023-03-20 DIAGNOSIS — R079 Chest pain, unspecified: Secondary | ICD-10-CM

## 2023-03-20 DIAGNOSIS — R5383 Other fatigue: Secondary | ICD-10-CM | POA: Insufficient documentation

## 2023-03-20 LAB — CBC
HCT: 44.4 % (ref 39.0–52.0)
Hemoglobin: 15.1 g/dL (ref 13.0–17.0)
MCH: 32.3 pg (ref 26.0–34.0)
MCHC: 34 g/dL (ref 30.0–36.0)
MCV: 94.9 fL (ref 80.0–100.0)
Platelets: 100 10*3/uL — ABNORMAL LOW (ref 150–400)
RBC: 4.68 MIL/uL (ref 4.22–5.81)
RDW: 13.2 % (ref 11.5–15.5)
WBC: 2.8 10*3/uL — ABNORMAL LOW (ref 4.0–10.5)
nRBC: 0 % (ref 0.0–0.2)

## 2023-03-20 LAB — BASIC METABOLIC PANEL
Anion gap: 8 (ref 5–15)
BUN: 15 mg/dL (ref 8–23)
CO2: 25 mmol/L (ref 22–32)
Calcium: 10.3 mg/dL (ref 8.9–10.3)
Chloride: 100 mmol/L (ref 98–111)
Creatinine, Ser: 1.07 mg/dL (ref 0.61–1.24)
GFR, Estimated: >60 mL/min (ref >60)
Glucose, Bld: 103 mg/dL — ABNORMAL HIGH (ref 70–99)
Potassium: 3.9 mmol/L (ref 3.5–5.1)
Sodium: 133 mmol/L — ABNORMAL LOW (ref 135–145)

## 2023-03-20 LAB — TROPONIN I (HIGH SENSITIVITY): Troponin I (High Sensitivity): 4 ng/L (ref <18)

## 2023-03-20 NOTE — ED Notes (Signed)
All appropriate discharge materials reviewed at length with patient. Time for questions provided. Pt has no other questions at this time and verbalizes understanding of all provided materials.  

## 2023-03-20 NOTE — ED Provider Notes (Signed)
[facility]GENC[facility] Emergency Department Provider Note MRN:  [id number]  Arrival date & time: 03/20/23     Chief Complaint   Chest Pain   History of Present Illness   Mike Arias is a 72 y.o. year-old male with a history of hyperlipidemia presenting to the ED with chief complaint of chest pain.  2 weeks of intermittent chest pain.  Also with severe fatigue with exertion.  The chest pain is also exertional.  Walked to the mailbox today and had to go back into the house and lay down.  Improves with rest.  He explains that overall his energy level is at 70% but when he exercises he quickly drops to 30%.  Normally very active prior to this.  Denies shortness of breath, no leg pain or swelling, no fever.  Review of Systems  A thorough review of systems was obtained and all systems are negative except as noted in the HPI and PMH.   Patient's Health History    Past Medical History:  Diagnosis Date   GERD (gastroesophageal reflux disease)    HLD (hyperlipidemia)    TIA (transient ischemic attack)    ? TIA    Past Surgical History:  Procedure Laterality Date   CERVICAL SPINE SURGERY     COLONOSCOPY     ESOPHAGOGASTRODUODENOSCOPY ENDOSCOPY     TONSILLECTOMY      Family History  Problem Relation Age of Onset   Lung cancer Father    Colon cancer Neg Hx    Esophageal cancer Neg Hx    Stomach cancer Neg Hx    Rectal cancer Neg Hx     Social History   Socioeconomic History   Marital status: Married    Spouse name: Not on file   Number of children: Not on file   Years of education: Not on file   Highest education level: Not on file  Occupational History   Not on file  Tobacco Use   Smoking status: Never   Smokeless tobacco: Never   Tobacco comments:    maybe a cigar on occasion  Vaping Use   Vaping Use: Never used  Substance and Sexual Activity   Alcohol use: Yes    Comment: 4 days a week   Drug use: No   Sexual activity: Not on file  Other Topics  Concern   Not on file  Social History Narrative   Not on file   Social Determinants of Health   Financial Resource Strain: Not on file  Food Insecurity: Not on file  Transportation Needs: Not on file  Physical Activity: Not on file  Stress: Not on file  Social Connections: Not on file  Intimate Partner Violence: Not on file     Physical Exam   Vitals:   03/20/23 2019  BP: 106/73  Pulse: 76  Resp: 17  Temp: 98.4 F (36.9 C)  SpO2: 100%    CONSTITUTIONAL: Well-appearing, NAD NEURO/PSYCH:  Alert and oriented x 3, no focal deficits EYES:  eyes equal and reactive ENT/NECK:  no LAD, no JVD CARDIO: Regular rate, well-perfused, normal S1 and S2 PULM:  CTAB no wheezing or rhonchi GI/GU:  non-distended, non-tender MSK/SPINE:  No gross deformities, no edema SKIN:  no rash, atraumatic   *Additional and/or pertinent findings included in MDM below  Diagnostic and Interventional Summary    EKG Interpretation Date/Time:  Thursday March 20 2023 20:24:12 EDT Ventricular Rate:  74 PR Interval:  184 QRS Duration:  94 QT Interval:  390 QTC Calculation: 432 R Axis:   73  Text Interpretation: Sinus rhythm with Fusion complexes Septal infarct (cited on or before 11-Apr-2010) Abnormal ECG When compared with ECG of 11-Oct-2021 19:07, Fusion complexes are now Present Questionable change in initial forces of Anterior leads Confirmed by Meridee Score [phone]) on 03/20/2023 8:27:59 PM       Labs Reviewed  BASIC METABOLIC PANEL - Abnormal; Notable for the following components:      Result Value   Sodium 133 (*)    Glucose, Bld 103 (*)    All other components within normal limits  CBC - Abnormal; Notable for the following components:   WBC 2.8 (*)    Platelets 100 (*)    All other components within normal limits  TROPONIN I (HIGH SENSITIVITY)  TROPONIN I (HIGH SENSITIVITY)    DG Chest 2 View  Final Result      Medications - No data to display   Procedures  /  Critical  Care Procedures  ED Course and Medical Decision Making  Initial Impression and Ddx Suspicious for cardiac cause.  However patient and his symptom-free at this time, sitting comfortably with normal vital signs.  EKG without any ischemic changes.  Troponin negative.  No leg pain or swelling, no tachycardia or hypoxia, highly doubt PE.  Early viral illness also considered.  Past medical/surgical history that increases complexity of ED encounter: None  Interpretation of Diagnostics I personally reviewed the EKG and my interpretation is as follows: Sinus rhythm without concerning ischemic features  Labs reassuring with no significant blood count or electrolyte disturbance, mild leukopenia, troponin negative.  Patient Reassessment and Ultimate Disposition/Management     Management options discussed, patient reassured that his EKG and labs are normal here, he is very comfortable following up with cardiology in the outpatient setting.  Patient management required discussion with the following services or consulting groups:  None  Complexity of Problems Addressed Acute illness or injury that poses threat of life of bodily function  Additional Data Reviewed and Analyzed Further history obtained from: Prior labs/imaging results  Additional Factors Impacting ED Encounter Risk None  Elmer Sow. Pilar Plate, MD [facility] Emergency Medicine [facility] [email or web address]  Final Clinical Impressions(s) / ED Diagnoses     ICD-10-CM   1. Chest pain, unspecified type  R07.9 Ambulatory referral to Cardiology      ED Discharge Orders          Ordered    Ambulatory referral to Cardiology        03/20/23 2347             Discharge Instructions Discussed with and Provided to Patient:    Discharge Instructions      You were evaluated in the Emergency Department and after careful evaluation, we did not find any emergent condition requiring admission or further testing in  the hospital.  Your exam/testing today was overall reassuring.  We recommend follow-up with your cardiology team to discuss your symptoms.  They may want to do further testing of your heart.  Until then would recommend avoiding significant exertional activities.  Please return to the Emergency Department if you experience any worsening of your condition.  Thank you for allowing [place] to be a part of your care.       Sabas Sous, MD 03/20/23 2350

## 2023-03-20 NOTE — ED Triage Notes (Signed)
POV from home, A&O x 4, amb to triage, GCS 15  C/o bilateral rib pain and left sided chest pain x 2 weeks, worse with exertion and sts he was exhausted after walking to the mailbox, seen PCP for same and not getting better.

## 2023-03-20 NOTE — Discharge Instructions (Addendum)
You were evaluated in the Emergency Department and after careful evaluation, we did not find any emergent condition requiring admission or further testing in the hospital.  Your exam/testing today was overall reassuring.  We recommend follow-up with your cardiology team to discuss your symptoms.  They may want to do further testing of your heart.  Until then would recommend avoiding significant exertional activities.  Please return to the Emergency Department if you experience any worsening of your condition.  Thank you for allowing [place] to be a part of your care.

## 2023-03-20 NOTE — ED Notes (Signed)
Pt called x 3 , no answer

## 2023-03-21 ENCOUNTER — Telehealth: Payer: Self-pay

## 2023-03-21 DIAGNOSIS — Z0000 Encounter for general adult medical examination without abnormal findings: Secondary | ICD-10-CM | POA: Diagnosis not present

## 2023-03-21 DIAGNOSIS — R5383 Other fatigue: Secondary | ICD-10-CM | POA: Diagnosis not present

## 2023-03-21 DIAGNOSIS — R0609 Other forms of dyspnea: Secondary | ICD-10-CM

## 2023-03-21 NOTE — Telephone Encounter (Signed)
-----   Message from Vesta Mixer, MD sent at 03/21/2023 10:40 AM EDT ----- Marlon Pel,  Elijah Birk is a patient of mine with hyperlipidemia who I last saw a year ago  He called me this morning and has been having fatigue and shortness of breath with exertion  He was seen in the ED yesterday   Will you see if there is an opening with an app next week to get him worked in I am happy to see him if I have any cancellations  It looks like I am DOD on July 15, I am happy to see him then  If he needs to be seen sooner, perhaps work him into any available DOD slot if it sounds urgent .    Lequisha Cammack, Will you order an echo for further evaluation of his DOE   I see that his WBC count is 2.8. He should discuss this with his primary md Perhaps he has a viral illness   This same information was sent via secure chat.  Thanks  PN

## 2023-03-21 NOTE — Telephone Encounter (Signed)
ECHO order placed and scheduled for first available. Appt made on 7/1 w/nahser who had a cancelation. Called patient and left voicemail per DPR advised to call back if cannot make these dates/times.

## 2023-03-23 ENCOUNTER — Encounter: Payer: Self-pay | Admitting: Cardiovascular Disease

## 2023-03-23 NOTE — Progress Notes (Unsigned)
Cardiology Office Note:    Date:  03/24/2023   ID:  Mike Arias, DOB [birth year], MRN [id number]  PCP:  Charlane Ferretti, DO  Cardiologist:  Kristeen Miss, MD  Electrophysiologist:  None   Referring MD: Charlane Ferretti, DO   Chief Complaint  Patient presents with   Shortness of Breath     December 07, 2018    Mike Arias is a 73 y.o. male with a recent  hx of a TIA. He is self referred ( friend and neighbor of Mike Edis, MD)   Mike Arias had an episode of inability to speak correctly  1 week ago The symptoms lasted 15 minutes - 30 min  and have not returned No weakness or numbness A few min of blurry vision No Cp , no dyspnea.    Lipids are elevated.   Checks his BP at the [facility] ,  Is occasionally elevated  Does not eat much salt  On no meds   Swam his 1.25 miles the next day without any issues   CT of the head was negative.  Carotid duplex revealed mild plaque.  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     Went to [employer].  Swam  stuideied busienss   April 06, 2019   Mike Arias is seen today for follow up of his TIA  Monitor showed NSR Echo shows nomal LV function.  Mild Ao dilitation    March 29, 2022 Mike Arias is seen for follow up of a TIA I last saw him int 2020 Event monitor showed NSR.   Echo shows normal LV function, mild aortic dilatation  Mike Arias has an episodes of aphasia in Jan. 2023 He went to the ER but within 15 min, his symptoms resolved.  MRA of the neck shoed a 50 vertebral stensis  He was seen by Dr. Pearlean Brownie.      Mike Arias is concerned about a friend who recently died of CAD and wanted to be evaluated   Very active.  No CP    March 24, 2023  Mike Arias is seen today for follow up of a TIA He has been having recent episodes of fatigue, DOE ,? Chest tighness  Has been trying to lose some weight  Has been trying Tirzepatide for weight loss  Has not been feeling well about 1 month after starting   Last Sunday,  he developed some additional chest soreness, some dyspnea  ,  lots of fatigue  and went to the ER  Work up was negative  WBC was 2.8   Denies any chest pressure  No neuro issues to suggest another TIA  No heart palpitations to suggest afib or other arrhythmias      Has lost 10-12 lbs   This morning , he felt as well as he has in the past 2 months   Is leaving for [place] / [place] next week .          ? Echo ? Cor CTA     Past Medical History:  Diagnosis Date   GERD (gastroesophageal reflux disease)    HLD (hyperlipidemia)    TIA (transient ischemic attack)    ? TIA    Past Surgical History:  Procedure Laterality Date   CERVICAL SPINE SURGERY     COLONOSCOPY     ESOPHAGOGASTRODUODENOSCOPY ENDOSCOPY     TONSILLECTOMY      Current Medications: Current Meds  Medication Sig   Aspirin 81 MG CAPS Take 325 mg by mouth daily.  rosuvastatin (CRESTOR) 10 MG tablet TAKE ONE TABLET BY MOUTH DAILY   valACYclovir (VALTREX) 500 MG tablet Take 500 mg by mouth daily as needed.     Allergies:   Patient has no known allergies.   Social History   Socioeconomic History   Marital status: Married    Spouse name: Not on file   Number of children: Not on file   Years of education: Not on file   Highest education level: Not on file  Occupational History   Not on file  Tobacco Use   Smoking status: Never   Smokeless tobacco: Never   Tobacco comments:    maybe a cigar on occasion  Vaping Use   Vaping Use: Never used  Substance and Sexual Activity   Alcohol use: Yes    Comment: 4 days a week   Drug use: No   Sexual activity: Not on file  Other Topics Concern   Not on file  Social History Narrative   Not on file   Social Determinants of Health   Financial Resource Strain: Not on file  Food Insecurity: Not on file  Transportation Needs: Not on file  Physical Activity: Not on file  Stress: Not on file  Social Connections: Not on file     Family History: The patient's family history includes Lung cancer in his  father. There is no history of Colon cancer, Esophageal cancer, Stomach cancer, or Rectal cancer.  ROS:   Please see the history of present illness.     All other systems reviewed and are negative.  EKGs/Labs/Other Studies Reviewed:    The following studies were reviewed today:   EKG:   Recent Labs: 03/29/2022: ALT 22 03/20/2023: BUN 15; Creatinine, Ser 1.07; Hemoglobin 15.1; Platelets 100; Potassium 3.9; Sodium 133  Recent Lipid Panel    Component Value Date/Time   CHOL 144 11/05/2022 0842   TRIG 78 11/05/2022 0842   HDL 46 11/05/2022 0842   CHOLHDL 3.1 11/05/2022 0842   LDLCALC 83 11/05/2022 0842     Physical Exam: Blood pressure 100/62, pulse 60, height 6' (1.829 m), weight 190 lb 3.2 oz (86.3 kg), SpO2 98 %.       GEN:  Well nourished, well developed in no acute distress HEENT: Normal NECK: No JVD; No carotid bruits LYMPHATICS: No lymphadenopathy CARDIAC: RRR , no murmurs, rubs, gallops RESPIRATORY:  Clear to auscultation without rales, wheezing or rhonchi  ABDOMEN: Soft, non-tender, non-distended MUSCULOSKELETAL:  No edema; No deformity  SKIN: Warm and dry NEUROLOGIC:  Alert and oriented x 3    ASSESSMENT:    1. Dyspnea on exertion   2. Other fatigue     PLAN:       TIA - history of TIA.  He is not having any neuro symptoms at this time.  2.  Fatigue/shortness of breath: His symptoms are very nonspecific.  They may be related to the [facility] that he started several months ago.  He went to the emergency room.  Troponins were negative.  EKG was unremarkable.  His white blood cell count was noted to be 2.8.  Will update his echocardiogram for further evaluation of his shortness of breath.  Will get a coronary calcium score to assess his overall risk for CAD .Marland Kitchen        2. Hyperlipidemia :    Managed by primary    3.  Remote hx of smoking:      I will see him back in 3 months  Medication  Adjustments/Labs and Tests Ordered: Current medicines are  reviewed at length with the patient today.  Concerns regarding medicines are outlined above.  Orders Placed This Encounter  Procedures   CT CARDIAC SCORING (SELF PAY ONLY)   No orders of the defined types were placed in this encounter.   Patient Instructions  Medication Instructions:  Your physician recommends that you continue on your current medications as directed. Please refer to the Current Medication list given to you today.  *If you need a refill on your cardiac medications before your next appointment, please call your pharmacy*   Lab Work: None  If you have labs (blood work) drawn today and your tests are completely normal, you will receive your results only by: MyChart Message (if you have MyChart) OR A paper copy in the mail If you have any lab test that is abnormal or we need to change your treatment, we will call you to review the results.   Testing/Procedures: Your physician has requested that you have a coronary calcium score performed. This is not covered by insurance and will be an out-of-pocket cost of approximately $99.    Follow-Up: At [facility], you and your health needs are our priority.  As part of our continuing mission to provide you with exceptional heart care, we have created designated Provider Care Teams.  These Care Teams include your primary Cardiologist (physician) and Advanced Practice Providers (APPs -  Physician Assistants and Nurse Practitioners) who all work together to provide you with the care you need, when you need it.  Your next appointment:   3 month(s)  Provider:   Kristeen Miss, MD       Signed, Kristeen Miss, MD  03/24/2023 10:03 AM    [facility]

## 2023-03-24 ENCOUNTER — Encounter: Payer: Self-pay | Admitting: Cardiovascular Disease

## 2023-03-24 ENCOUNTER — Ambulatory Visit: Payer: Medicare Other | Attending: Cardiovascular Disease | Admitting: Cardiovascular Disease

## 2023-03-24 VITALS — BP 100/62 | HR 60 | Ht 72.0 in | Wt 190.2 lb

## 2023-03-24 DIAGNOSIS — R0609 Other forms of dyspnea: Secondary | ICD-10-CM | POA: Diagnosis not present

## 2023-03-24 DIAGNOSIS — R5383 Other fatigue: Secondary | ICD-10-CM | POA: Diagnosis not present

## 2023-03-24 DIAGNOSIS — R0600 Dyspnea, unspecified: Secondary | ICD-10-CM | POA: Insufficient documentation

## 2023-03-24 NOTE — Patient Instructions (Addendum)
Medication Instructions:  Your physician recommends that you continue on your current medications as directed. Please refer to the Current Medication list given to you today.  *If you need a refill on your cardiac medications before your next appointment, please call your pharmacy*   Lab Work: None  If you have labs (blood work) drawn today and your tests are completely normal, you will receive your results only by: MyChart Message (if you have MyChart) OR A paper copy in the mail If you have any lab test that is abnormal or we need to change your treatment, we will call you to review the results.   Testing/Procedures: Your physician has requested that you have a coronary calcium score performed. This is not covered by insurance and will be an out-of-pocket cost of approximately $99.    Follow-Up: At [facility], you and your health needs are our priority.  As part of our continuing mission to provide you with exceptional heart care, we have created designated Provider Care Teams.  These Care Teams include your primary Cardiologist (physician) and Advanced Practice Providers (APPs -  Physician Assistants and Nurse Practitioners) who all work together to provide you with the care you need, when you need it.  Your next appointment:   3 month(s)  Provider:   Kristeen Miss, MD

## 2023-03-25 DIAGNOSIS — E785 Hyperlipidemia, unspecified: Secondary | ICD-10-CM | POA: Diagnosis not present

## 2023-03-26 ENCOUNTER — Telehealth: Payer: Self-pay

## 2023-03-26 DIAGNOSIS — D692 Other nonthrombocytopenic purpura: Secondary | ICD-10-CM | POA: Insufficient documentation

## 2023-03-26 DIAGNOSIS — Z8616 Personal history of COVID-19: Secondary | ICD-10-CM | POA: Insufficient documentation

## 2023-03-26 DIAGNOSIS — G629 Polyneuropathy, unspecified: Secondary | ICD-10-CM | POA: Insufficient documentation

## 2023-03-26 DIAGNOSIS — Z0000 Encounter for general adult medical examination without abnormal findings: Secondary | ICD-10-CM | POA: Insufficient documentation

## 2023-03-26 DIAGNOSIS — D696 Thrombocytopenia, unspecified: Secondary | ICD-10-CM | POA: Insufficient documentation

## 2023-03-26 DIAGNOSIS — R413 Other amnesia: Secondary | ICD-10-CM | POA: Insufficient documentation

## 2023-03-26 DIAGNOSIS — R351 Nocturia: Secondary | ICD-10-CM | POA: Insufficient documentation

## 2023-03-26 DIAGNOSIS — R0789 Other chest pain: Secondary | ICD-10-CM | POA: Insufficient documentation

## 2023-03-26 DIAGNOSIS — D7589 Other specified diseases of blood and blood-forming organs: Secondary | ICD-10-CM | POA: Insufficient documentation

## 2023-03-26 DIAGNOSIS — R0781 Pleurodynia: Secondary | ICD-10-CM | POA: Insufficient documentation

## 2023-03-26 DIAGNOSIS — G3184 Mild cognitive impairment, so stated: Secondary | ICD-10-CM | POA: Insufficient documentation

## 2023-03-26 NOTE — Telephone Encounter (Signed)
Dr. Myrtie Neither,   Just reaching out as an FYI this patient is scheduled for a colon with you on 8/6 but it looks like he had an ED visit for CP and fatigue on 6/27. EKG and troponin both non remarkable at this time. Pt has followed up with cardiology he is scheduled to have an ECHO on 7/15 and a CT cardiac scoring on 8/7. Are you ok to proceed pending ECHO or would you like both of his test complete before proceeding with the colonoscopy? Please advise.

## 2023-03-26 NOTE — Telephone Encounter (Signed)
Thank you for checking.  Our anesthesia provider will want this patient to have his cardiac workup complete and normal prior to the colonoscopy.  Please move the [facility] pre-visit and colonoscopy until after that cardiac testing is complete.   - H. Danis

## 2023-04-15 ENCOUNTER — Ambulatory Visit (HOSPITAL_COMMUNITY): Payer: Medicare Other | Attending: Cardiology

## 2023-04-15 DIAGNOSIS — R0609 Other forms of dyspnea: Secondary | ICD-10-CM | POA: Insufficient documentation

## 2023-04-15 LAB — ECHOCARDIOGRAM COMPLETE
Area-P 1/2: 3.17 cm2
S' Lateral: 2.7 cm

## 2023-04-17 ENCOUNTER — Telehealth: Payer: Self-pay

## 2023-04-17 DIAGNOSIS — I77819 Aortic ectasia, unspecified site: Secondary | ICD-10-CM

## 2023-04-17 DIAGNOSIS — Z01810 Encounter for preprocedural cardiovascular examination: Secondary | ICD-10-CM

## 2023-04-17 NOTE — Telephone Encounter (Signed)
-----   Message from Kristeen Miss sent at 04/15/2023 10:30 AM EDT ----- Normal left ventricular systolic function with EF of 65 to 70%.  He has grade 2 diastolic dysfunction.  There is moderate left ventricular hypertrophy. 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  His coronary calcium score did not show aortic dilatation  Please order a CTA of his aorta for further evaluation of his possible dilated aorta

## 2023-04-17 NOTE — Telephone Encounter (Signed)
Pt has viewed physcian's comments and interpretation via Mychart. Order placed at this time for CTA of aorta and BMET. At last OV on 03/24/23, pt's heart rate was 60bpm, so  Instructions for test sent via MyChart. Asked pt to let us know once test has been scheduled, when he can come in for lab.

## 2023-04-29 ENCOUNTER — Encounter: Payer: Medicare Other | Admitting: Gastroenterology

## 2023-04-30 ENCOUNTER — Ambulatory Visit (HOSPITAL_BASED_OUTPATIENT_CLINIC_OR_DEPARTMENT_OTHER): Admission: RE | Admit: 2023-04-30 | Payer: Medicare Other | Source: Ambulatory Visit

## 2023-04-30 ENCOUNTER — Ambulatory Visit (HOSPITAL_COMMUNITY)
Admission: RE | Admit: 2023-04-30 | Discharge: 2023-04-30 | Disposition: A | Discharge status: Home / Self Care | Payer: Medicare Other | Source: Ambulatory Visit | Attending: Cardiovascular Disease | Admitting: Cardiovascular Disease

## 2023-04-30 DIAGNOSIS — I7121 Aneurysm of the ascending aorta, without rupture: Secondary | ICD-10-CM | POA: Diagnosis not present

## 2023-04-30 DIAGNOSIS — I77819 Aortic ectasia, unspecified site: Secondary | ICD-10-CM | POA: Diagnosis not present

## 2023-04-30 DIAGNOSIS — I2510 Atherosclerotic heart disease of native coronary artery without angina pectoris: Secondary | ICD-10-CM | POA: Diagnosis not present

## 2023-04-30 MED ORDER — IOHEXOL 350 MG/ML SOLN
75.0000 mL | Freq: Once | INTRAVENOUS | Status: AC | PRN
  Administered 2023-04-30: 75 mL via INTRAVENOUS

## 2023-05-12 DIAGNOSIS — J069 Acute upper respiratory infection, unspecified: Secondary | ICD-10-CM | POA: Diagnosis not present

## 2023-05-12 DIAGNOSIS — N390 Urinary tract infection, site not specified: Secondary | ICD-10-CM | POA: Diagnosis not present

## 2023-05-12 DIAGNOSIS — R509 Fever, unspecified: Secondary | ICD-10-CM | POA: Diagnosis not present

## 2023-05-13 DIAGNOSIS — R5383 Other fatigue: Secondary | ICD-10-CM | POA: Diagnosis not present

## 2023-05-13 DIAGNOSIS — W57XXXD Bitten or stung by nonvenomous insect and other nonvenomous arthropods, subsequent encounter: Secondary | ICD-10-CM | POA: Diagnosis not present

## 2023-05-13 DIAGNOSIS — R61 Generalized hyperhidrosis: Secondary | ICD-10-CM | POA: Diagnosis not present

## 2023-05-14 ENCOUNTER — Encounter: Payer: Self-pay | Admitting: Gastroenterology

## 2023-05-14 ENCOUNTER — Ambulatory Visit (AMBULATORY_SURGERY_CENTER): Payer: Medicare Other

## 2023-05-14 VITALS — Ht 73.0 in | Wt 190.0 lb

## 2023-05-14 DIAGNOSIS — Z86010 Personal history of colonic polyps: Secondary | ICD-10-CM

## 2023-05-14 NOTE — Progress Notes (Signed)

## 2023-05-15 DIAGNOSIS — M170 Bilateral primary osteoarthritis of knee: Secondary | ICD-10-CM | POA: Diagnosis not present

## 2023-05-17 ENCOUNTER — Encounter: Payer: Self-pay | Admitting: Certified Registered Nurse Anesthetist

## 2023-05-19 DIAGNOSIS — R5383 Other fatigue: Secondary | ICD-10-CM | POA: Diagnosis not present

## 2023-05-20 ENCOUNTER — Encounter: Payer: Self-pay | Admitting: Gastroenterology

## 2023-05-20 ENCOUNTER — Ambulatory Visit (AMBULATORY_SURGERY_CENTER): Payer: Medicare Other | Admitting: Gastroenterology

## 2023-05-20 VITALS — BP 120/65 | HR 38 | Temp 98.0°F | Resp 14

## 2023-05-20 DIAGNOSIS — Z86010 Personal history of colonic polyps: Secondary | ICD-10-CM

## 2023-05-20 DIAGNOSIS — D122 Benign neoplasm of ascending colon: Secondary | ICD-10-CM | POA: Diagnosis not present

## 2023-05-20 DIAGNOSIS — Z09 Encounter for follow-up examination after completed treatment for conditions other than malignant neoplasm: Secondary | ICD-10-CM | POA: Diagnosis not present

## 2023-05-20 DIAGNOSIS — D120 Benign neoplasm of cecum: Secondary | ICD-10-CM | POA: Diagnosis not present

## 2023-05-20 DIAGNOSIS — K635 Polyp of colon: Secondary | ICD-10-CM | POA: Diagnosis not present

## 2023-05-20 MED ORDER — SODIUM CHLORIDE 0.9 % IV SOLN
500.0000 mL | Freq: Once | INTRAVENOUS | Status: DC
Start: 2023-05-20 — End: 2023-05-20

## 2023-05-20 NOTE — Patient Instructions (Addendum)
Handouts Provided:  Polyps and Diverticulosis  REPEAT Colonoscopy in 1 year for surveillance.    YOU HAD AN ENDOSCOPIC PROCEDURE TODAY AT THE [facility]:   Refer to the procedure report that was given to you for any specific questions about what was found during the examination.  If the procedure report does not answer your questions, please call your gastroenterologist to clarify.  If you requested that your care partner not be given the details of your procedure findings, then the procedure report has been included in a sealed envelope for you to review at your convenience later.  YOU SHOULD EXPECT: Some feelings of bloating in the abdomen. Passage of more gas than usual.  Walking can help get rid of the air that was put into your GI tract during the procedure and reduce the bloating. If you had a lower endoscopy (such as a colonoscopy or flexible sigmoidoscopy) you may notice spotting of blood in your stool or on the toilet paper. If you underwent a bowel prep for your procedure, you may not have a normal bowel movement for a few days.  Please Note:  You might notice some irritation and congestion in your nose or some drainage.  This is from the oxygen used during your procedure.  There is no need for concern and it should clear up in a day or so.  SYMPTOMS TO REPORT IMMEDIATELY:  Following lower endoscopy (colonoscopy or flexible sigmoidoscopy):  Excessive amounts of blood in the stool  Significant tenderness or worsening of abdominal pains  Swelling of the abdomen that is new, acute  Fever of 100F or higher  For urgent or emergent issues, a gastroenterologist can be reached at any hour by calling [phone]. Do not use MyChart messaging for urgent concerns.    DIET:  We do recommend a small meal at first, but then you may proceed to your regular diet.  Drink plenty of fluids but you should avoid alcoholic beverages for 24 hours.  ACTIVITY:  You should plan to take it  easy for the rest of today and you should NOT DRIVE or use heavy machinery until tomorrow (because of the sedation medicines used during the test).    FOLLOW UP: Our staff will call the number listed on your records the next business day following your procedure.  We will call around 7:15- 8:00 am to check on you and address any questions or concerns that you may have regarding the information given to you following your procedure. If we do not reach you, we will leave a message.     If any biopsies were taken you will be contacted by phone or by letter within the next 1-3 weeks.  Please call us at [phone] if you have not heard about the biopsies in 3 weeks.    SIGNATURES/CONFIDENTIALITY: You and/or your care partner have signed paperwork which will be entered into your electronic medical record.  These signatures attest to the fact that that the information above on your After Visit Summary has been reviewed and is understood.  Full responsibility of the confidentiality of this discharge information lies with you and/or your care-partner.

## 2023-05-20 NOTE — Op Note (Signed)
[facility] Patient Name: Mike Arias Procedure Date: 05/20/2023 9:23 AM MRN: [id number] Endoscopist: Sherilyn Cooter L. Myrtie Neither , MD, [id number] Age: 72 Referring MD:  Date of Birth: 05-26-1951 Gender: Male Account #: [id number] Procedure:                Colonoscopy Indications:              Surveillance: Personal history of adenomatous                            polyps on last colonoscopy 3 years ago                           July 2021 - 12mm cecal TA and two diminutive left                            colon TAs (miralax prep)                           June 2018 - TA x 5 Medicines:                Monitored Anesthesia Care Procedure:                Pre-Anesthesia Assessment:                           - Prior to the procedure, a History and Physical                            was performed, and patient medications and                            allergies were reviewed. The patient's tolerance of                            previous anesthesia was also reviewed. The risks                            and benefits of the procedure and the sedation                            options and risks were discussed with the patient.                            All questions were answered, and informed consent                            was obtained. Prior Anticoagulants: The patient has                            taken no anticoagulant or antiplatelet agents. ASA                            Grade Assessment: III - A patient with severe  systemic disease. After reviewing the risks and                            benefits, the patient was deemed in satisfactory                            condition to undergo the procedure.                           After obtaining informed consent, the colonoscope                            was passed under direct vision. Throughout the                            procedure, the patient's blood pressure, pulse, and                             oxygen saturations were monitored continuously. The                            Olympus CF-HQ190L ([id number]) Colonoscope was                            introduced through the anus and advanced to the the                            cecum, identified by appendiceal orifice and                            ileocecal valve. The colonoscopy was performed                            without difficulty. The patient tolerated the                            procedure well. The quality of the bowel                            preparation was fair. The ileocecal valve,                            appendiceal orifice, and rectum were photographed.                            The bowel preparation used was Miralax via split                            dose instruction. Scope In: 9:38:54 AM Scope Out: 9:59:05 AM Scope Withdrawal Time: 0 hours 16 minutes 8 seconds  Total Procedure Duration: 0 hours 20 minutes 11 seconds  Findings:                 The perianal and digital rectal examinations were  normal.                           A 4 mm polyp was found in the cecum. The polyp was                            sessile on the scar of a prior polypectomy site.                            The polyp was removed with a cold snare. Resection                            and retrieval were complete.                           Two sessile polyps were found in the transverse                            colon and ascending colon. The polyps were                            diminutive in size. These polyps were removed with                            a cold snare. Resection and retrieval were complete.                           Repeat examination of right colon under NBI                            performed.                           Multiple small-mouthed diverticula were found in                            the left colon.                           The exam was otherwise without abnormality on                             direct and retroflexion views. Complications:            No immediate complications. Estimated Blood Loss:     Estimated blood loss was minimal. Impression:               - Preparation of the colon was fair.                           - One 4 mm polyp in the cecum, removed with a cold                            snare. Resected and retrieved.                           -  Two diminutive polyps in the transverse colon and                            in the ascending colon, removed with a cold snare.                            Resected and retrieved.                           - Diverticulosis in the left colon.                           - The examination was otherwise normal on direct                            and retroflexion views. Recommendation:           - Patient has a contact number available for                            emergencies. The signs and symptoms of potential                            delayed complications were discussed with the                            patient. Return to normal activities tomorrow.                            Written discharge instructions were provided to the                            patient.                           - Resume previous diet.                           - Continue present medications.                           - Await pathology results.                           - Repeat colonoscopy in 1 year for surveillance.                            SUPREP FOR NEXT EXAM. Also, close attention to                            preprocedure dietary retrictions re: fibrous foods. [name], MD 05/20/2023 10:10:59 AM This report has been signed electronically.

## 2023-05-20 NOTE — Progress Notes (Signed)
Called to room to assist during endoscopic procedure.  Patient ID and intended procedure confirmed with present staff. Received instructions for my participation in the procedure from the performing physician.  

## 2023-05-20 NOTE — Progress Notes (Signed)
Pt's states no medical or surgical changes since previsit or office visit. 

## 2023-05-20 NOTE — Progress Notes (Signed)
History and Physical:  This patient presents for endoscopic testing for: Encounter Diagnosis  Name Primary?   Personal history of colonic polyps Yes    Surveillance colonoscopy.   July 2021 - 12mm cecal TA and 2 diminutive left colon TAs TA x 25 February 2017 Patient denies chronic abdominal pain, rectal bleeding, constipation or diarrhea.  Patient is otherwise without complaints or active issues today.   Past Medical History: Past Medical History:  Diagnosis Date   GERD (gastroesophageal reflux disease)    HLD (hyperlipidemia)    TIA (transient ischemic attack)    ? TIA     Past Surgical History: Past Surgical History:  Procedure Laterality Date   CERVICAL SPINE SURGERY     COLONOSCOPY     ESOPHAGOGASTRODUODENOSCOPY ENDOSCOPY     TONSILLECTOMY      Allergies: No Known Allergies  Outpatient Meds: Current Outpatient Medications  Medication Sig Dispense Refill   Multiple Vitamins-Minerals (ONE-A-DAY MENS 50+ ADVANTAGE) TABS take one tablet by mouth everyday Oral     rosuvastatin (CRESTOR) 10 MG tablet TAKE ONE TABLET BY MOUTH DAILY 90 tablet 2   tamsulosin (FLOMAX) 0.4 MG CAPS capsule 1 capsule, cautioned with dizziness with standing Orally Once a day for 30 day(s)     AMBIEN 5 MG tablet 1 tablet at bedtime as needed Orally Once a day for 30 days     Aspirin 81 MG CAPS Take 325 mg by mouth daily.     B Complex-C (VITAMIN B + C COMPLEX PO) Take 1 tablet by mouth daily.     Cyanocobalamin (VITAMIN B12) 1000 MCG TBCR 1 tablet Orally Once a day for 30 day(s)     tadalafil (CIALIS) 10 MG tablet Take 10 mg by mouth daily as needed. For erectile dysfunction 30-45 minutes prior to sexual activity (Patient not taking: Reported on 03/29/2022)     valACYclovir (VALTREX) 500 MG tablet Take 500 mg by mouth daily as needed.     Current Facility-Administered Medications  Medication Dose Route Frequency Provider Last Rate Last Admin   0.9 %  sodium chloride infusion  500 mL Intravenous Once  Danis, Starr Lake III, MD          ___________________________________________________________________ Objective   Exam:  Temp 98 F (36.7 C)   CV: regular , S1/S2 Resp: clear to auscultation bilaterally, normal RR and effort noted GI: soft, no tenderness, with active bowel sounds.   Assessment: Encounter Diagnosis  Name Primary?   Personal history of colonic polyps Yes     Plan: Colonoscopy   The benefits and risks of the planned procedure were described in detail with the patient or (when appropriate) their health care proxy.  Risks were outlined as including, but not limited to, bleeding, infection, perforation, adverse medication reaction leading to cardiac or pulmonary decompensation, pancreatitis (if ERCP).  The limitation of incomplete mucosal visualization was also discussed.  No guarantees or warranties were given.  The patient is appropriate for an endoscopic procedure in the ambulatory setting.   - Amada Jupiter, MD

## 2023-05-21 ENCOUNTER — Telehealth: Payer: Self-pay

## 2023-05-21 NOTE — Telephone Encounter (Signed)
  Follow up Call-     05/20/2023    9:05 AM  Call back number  Post procedure Call Back phone  # [phone]  Permission to leave phone message Yes     Patient questions:  Do you have a fever, pain , or abdominal swelling? No. Pain Score  0 *  Have you tolerated food without any problems? Yes.    Have you been able to return to your normal activities? Yes.    Do you have any questions about your discharge instructions: Diet   No. Medications  No. Follow up visit  No.  Do you have questions or concerns about your Care? No.  Actions: * If pain score is 4 or above: No action needed, pain <4.

## 2023-05-23 DIAGNOSIS — D696 Thrombocytopenia, unspecified: Secondary | ICD-10-CM | POA: Diagnosis not present

## 2023-05-27 ENCOUNTER — Ambulatory Visit: Payer: Medicare Other | Admitting: Internal Medicine

## 2023-05-27 ENCOUNTER — Other Ambulatory Visit: Payer: Self-pay

## 2023-05-27 ENCOUNTER — Encounter: Payer: Self-pay | Admitting: Internal Medicine

## 2023-05-27 VITALS — BP 116/74 | HR 61 | Temp 98.0°F | Wt 194.2 lb

## 2023-05-27 DIAGNOSIS — R5383 Other fatigue: Secondary | ICD-10-CM | POA: Diagnosis not present

## 2023-05-27 DIAGNOSIS — D3131 Benign neoplasm of right choroid: Secondary | ICD-10-CM | POA: Diagnosis not present

## 2023-05-27 NOTE — Progress Notes (Signed)
[facility]      Reason for Consult: tick bite    Referring Physician: Dr. Thornell Mule    Patient ID: Mike Arias, male    DOB: 08/08/1951, 72 y.o.   MRN: [id number]  HPI:   Mike Arias is here for evaluation of fatigue after a tick bite.   He reports a history of profound fatigue that he feels really began about 6 months ago.  He describes the fatigue as limiting his daily activities and he has become more deconditioned because of it.  No associated weight loss, diarrhea, no headaches, no vision changes.  No rash.  No joint swelling.  Has been evaluated for tick related infections but has not had a corresponding fever, rash or headache.  No new medications.  Similar diet.  Some night sweats and a recent cough over the last 3 weeks with some productivity.  Some leukopenia on one lab but resolved.  Has chronic thrombocytopenia.  Wife had hepatitis C.  He does not recall any testing.  Recent CT inlcuding of his lungs and no significant findings.   Past Medical History:  Diagnosis Date   GERD (gastroesophageal reflux disease)    HLD (hyperlipidemia)    TIA (transient ischemic attack)    ? TIA    Prior to Admission medications   Medication Sig Start Date End Date Taking? Authorizing Provider  AMBIEN 5 MG tablet 1 tablet at bedtime as needed Orally Once a day for 30 days 03/25/23   [provider]  Aspirin 81 MG CAPS Take 325 mg by mouth daily.    [provider]  B Complex-C (VITAMIN B + C COMPLEX PO) Take 1 tablet by mouth daily.    [provider]  Cyanocobalamin (VITAMIN B12) 1000 MCG TBCR 1 tablet Orally Once a day for 30 day(s)    [provider]  Multiple Vitamins-Minerals (ONE-A-DAY MENS 50+ ADVANTAGE) TABS take one tablet by mouth everyday Oral 09/30/19   [provider]  rosuvastatin (CRESTOR) 10 MG tablet TAKE ONE TABLET BY MOUTH DAILY 03/28/20   Nahser, Deloris Ping, MD  tadalafil (CIALIS) 10 MG tablet Take 10 mg by mouth  daily as needed. For erectile dysfunction 30-45 minutes prior to sexual activity Patient not taking: Reported on 03/29/2022    [provider]  tamsulosin (FLOMAX) 0.4 MG CAPS capsule 1 capsule, cautioned with dizziness with standing Orally Once a day for 30 day(s)    [provider]  valACYclovir (VALTREX) 500 MG tablet Take 500 mg by mouth daily as needed.    [provider]    No Known Allergies  Social History   Tobacco Use   Smoking status: Never   Smokeless tobacco: Never   Tobacco comments:    maybe a cigar on occasion  Vaping Use   Vaping status: Never Used  Substance Use Topics   Alcohol use: Yes    Comment: 4 days a week   Drug use: No    Family History  Problem Relation Age of Onset   Lung cancer Father    Colon cancer Neg Hx    Esophageal cancer Neg Hx    Stomach cancer Neg Hx    Rectal cancer Neg Hx    Colon polyps Neg Hx     Review of Systems  Constitutional: negative for fevers and chills All other systems reviewed and are negative    Constitutional: in no apparent distress There were no vitals filed for this  visit. EYES: anicteric Respiratory: normal respiratory effort Musculoskeletal: no edema Skin: no rash  Labs: Lab Results  Component Value Date   WBC 2.8 (L) 03/20/2023   HGB 15.1 03/20/2023   HCT 44.4 03/20/2023   MCV 94.9 03/20/2023   PLT 100 (L) 03/20/2023    Lab Results  Component Value Date   CREATININE 1.07 03/20/2023   BUN 15 03/20/2023   NA 133 (L) 03/20/2023   K 3.9 03/20/2023   CL 100 03/20/2023   CO2 25 03/20/2023    Lab Results  Component Value Date   ALT 22 03/29/2022   AST 23 10/11/2021   ALKPHOS 51 10/11/2021   BILITOT 0.5 10/11/2021   INR 1.0 10/11/2021     Assessment: fatigue, unclear etiology.  I discussed with him general causes of fatigue and infectious considerations.  I discussed HIV and chronic hepatitis C.  I also discussed multiple myeloma as a more difficult cause to determine.    Plan: 1)  will check labs including above Will have him return if anything positive that needs addressing.

## 2023-05-28 ENCOUNTER — Institutional Professional Consult (permissible substitution): Payer: Medicare Other | Admitting: Surgical

## 2023-05-28 VITALS — BP 128/85 | HR 58 | Resp 18 | Ht 73.0 in | Wt 194.0 lb

## 2023-05-28 DIAGNOSIS — I7121 Aneurysm of the ascending aorta, without rupture: Secondary | ICD-10-CM

## 2023-05-28 NOTE — Patient Instructions (Signed)
Lifestyle modifications and blood pressure control as discussed.

## 2023-05-28 NOTE — Progress Notes (Signed)
Subjective:     Patient ID: Mike Arias, male    DOB: 30-Jun-1951, 72 y.o.   MRN: [id number]  Chief Complaint  Patient presents with   Thoracic Aortic Aneurysm    CTA Chest 04/30/23    HPI Patient is in today for surveillance program for 4.5 cm thoracic aortic aneurysm.  This was noted on CT scan dated 05/01/2023.  He denies chest pain.  He has no  history of hypertension.  He does report he has a fair amount of increased fatigue in the last approximate 4 months.  He has been seen by factious disease and being worked up for potential rickettsial disease.  He did have a cardiac CT scoring done in July 2023 which showed some three-vessel findings and a score of 154.  He reports that he exercises primarily by swimming but has not been doing as much as he is used to.  Other cardiac risk factors include diagnoses of hyperlipidemia and possible history of previous TIA.  He is a non-smoker.  Review of Systems  Constitutional:  Positive for malaise/fatigue.  HENT:  Positive for hearing loss.   Respiratory:  Positive for cough, sputum production and shortness of breath.   Genitourinary:  Positive for frequency.  Endo/Heme/Allergies:  Bruises/bleeds easily.  Psychiatric/Behavioral:  Positive for memory loss.     Past Medical History:  Diagnosis Date   GERD (gastroesophageal reflux disease)    HLD (hyperlipidemia)    TIA (transient ischemic attack)    ? TIA   Past Surgical History:  Procedure Laterality Date   CERVICAL SPINE SURGERY     COLONOSCOPY     ESOPHAGOGASTRODUODENOSCOPY ENDOSCOPY     TONSILLECTOMY     Current Outpatient Medications  Medication Instructions   AMBIEN 5 MG tablet 1 tablet at bedtime as needed Orally Once a day for 30 days   Aspirin 325 mg, Daily   B Complex-C (VITAMIN B + C COMPLEX PO) 1 tablet, Oral, Daily   Cyanocobalamin (VITAMIN B12) 1000 MCG TBCR 1 tablet Orally Once a day for 30 day(s)   doxycycline (VIBRA-TABS) 100 mg, Oral, 2 times daily   Multiple  Vitamins-Minerals (ONE-A-DAY MENS 50+ ADVANTAGE) TABS take one tablet by mouth everyday Oral   rosuvastatin (CRESTOR) 10 MG tablet TAKE ONE TABLET BY MOUTH DAILY   tadalafil (CIALIS) 10 mg, Oral, Daily PRN, For erectile dysfunction 30-45 minutes prior to sexual activity   tamsulosin (FLOMAX) 0.4 MG CAPS capsule 1 capsule, cautioned with dizziness with standing Orally Once a day for 30 day(s)   valACYclovir (VALTREX) 500 mg, Oral, Daily PRN    No Known Allergies   Social History   Occupational History   Not on file  Tobacco Use   Smoking status: Never   Smokeless tobacco: Never   Tobacco comments:    maybe a cigar on occasion  Vaping Use   Vaping status: Never Used  Substance and Sexual Activity   Alcohol use: Yes    Comment: 4 days a week   Drug use: No   Sexual activity: Not on file        Objective:    There were no vitals taken for this visit. BP Readings from Last 3 Encounters:  05/28/23 128/85  05/27/23 116/74  05/20/23 120/65   Wt Readings from Last 3 Encounters:  05/28/23 194 lb (88 kg)  05/27/23 194 lb 3.2 oz (88.1 kg)  05/14/23 190 lb (86.2 kg)      Physical Exam Constitutional:  Appearance: Normal appearance. He is normal weight. He is not ill-appearing.  HENT:     Head: Normocephalic and atraumatic.  Eyes:     Pupils: Pupils are equal, round, and reactive to light.  Neck:     Vascular: No carotid bruit.  Cardiovascular:     Rate and Rhythm: Normal rate and regular rhythm.     Heart sounds: Normal heart sounds. No murmur heard. Pulmonary:     Effort: Pulmonary effort is normal.  Abdominal:     General: Abdomen is flat.     Palpations: Abdomen is soft.  Musculoskeletal:     Cervical back: Normal range of motion.     Right lower leg: No edema.     Left lower leg: No edema.  Lymphadenopathy:     Cervical: No cervical adenopathy.  Skin:    Capillary Refill: Capillary refill takes less than 2 seconds.  Neurological:     General: No focal  deficit present.     Mental Status: He is alert.     Motor: No weakness.     No results found for any visits on 05/28/23. Narrative & Impression  CLINICAL DATA:  Dilated thoracic aorta by echocardiography.   EXAM: CT ANGIOGRAPHY CHEST WITH CONTRAST   TECHNIQUE: Multidetector CT imaging of the chest was performed using the standard protocol during bolus administration of intravenous contrast. Multiplanar CT image reconstructions and MIPs were obtained to evaluate the vascular anatomy.   RADIATION DOSE REDUCTION: This exam was performed according to the departmental dose-optimization program which includes automated exposure control, adjustment of the mA and/or kV according to patient size and/or use of iterative reconstruction technique.   CONTRAST:  75mL OMNIPAQUE IOHEXOL 350 MG/ML SOLN   COMPARISON:  Coronary calcium score study on 04/17/2022   FINDINGS: Cardiovascular: The aortic root at the level of the sinuses of Valsalva measures approximately 4.5-4.6 cm. The ascending thoracic aorta is normal in caliber and measures approximately 3.6 cm in greatest diameter. The proximal arch measures 3.3 cm and the distal arch 3.1 cm. The descending thoracic aorta measures 3 cm. No evidence of aortic dissection. Visualized proximal great vessels demonstrate normal patency and normal variant separate origin of the left vertebral artery off of the aortic arch.   The heart size is at the upper limits of normal. Scattered calcified coronary artery plaque again visualized. Central pulmonary arteries are normal in caliber.   Mediastinum/Nodes: No enlarged mediastinal, hilar, or axillary lymph nodes. Thyroid gland, trachea, and esophagus demonstrate no significant findings.   Lungs/Pleura: Stable bilateral pulmonary scarring. There is no evidence of pulmonary edema, consolidation, pneumothorax, nodule or pleural fluid.   Upper Abdomen: No acute abnormality.   Musculoskeletal:  Numerous old right-sided rib fractures again noted with prior cerclage of lateral sixth, seventh and eighth ribs.   Review of the MIP images confirms the above findings.   IMPRESSION: 1. Aneurysmal disease of the aortic root at the level of the sinuses of Valsalva measuring approximately 4.5-4.6 cm. The ascending thoracic aorta is normal in caliber and measures approximately 3.6 cm in greatest diameter. Ascending thoracic aortic aneurysm. Recommend semi-annual imaging followup by CTA or MRA and referral to cardiothoracic surgery if not already obtained. This recommendation follows 2010 ACCF/AHA/AATS/ACR/ASA/SCA/SCAI/SIR/STS/SVM Guidelines for the Diagnosis and Management of Patients With Thoracic Aortic Disease. Circulation. 2010; 121: Z610-R604. Aortic aneurysm NOS (ICD10-I71.9) 2. Calcified coronary artery plaque. 3. Numerous old right-sided rib fractures with prior cerclage of lateral sixth, seventh and eighth ribs.   Aortic aneurysm NOS (  ICD10-I71.9).     Electronically Signed   By: Irish Lack M.D.   On: 05/01/2023 16:58       ECHOCARDIOGRAM REPORT       Patient Name:   LEWIE MIGNANO Date of Exam: 04/15/2023  Medical Rec #:  [id number]      Height:       72.0 in  Accession #:    [id number]     Weight:       190.2 lb  Date of Birth:  06-Jul-1951       BSA:          2.086 m  Patient Age:    72 years       BP:           100/62 mmHg  Patient Gender: M              HR:           70 bpm.  Exam Location:  [address]   Procedure: 2D Echo, Cardiac Doppler and Color Doppler   Indications:    R06.09 SOB    History:        Patient has prior history of Echocardiogram examinations,  most                 recent 12/07/2018. Dilated aortic root,  Signs/Symptoms:Shortness                 of Breath; Risk Factors:Dyslipidemia.    Sonographer:    Samule Ohm RDCS  Referring Phys: [phone] PHILIP J NAHSER   IMPRESSIONS     1. Left ventricular ejection fraction, by  estimation, is 65 to 70%. The  left ventricle has normal function. The left ventricle has no regional  wall motion abnormalities. There is moderate left ventricular hypertrophy.  Left ventricular diastolic  parameters are consistent with Grade II diastolic dysfunction  (pseudonormalization).   2. Right ventricular systolic function is normal. The right ventricular  size is normal. Tricuspid regurgitation signal is inadequate for assessing  PA pressure.   3. Left atrial size was moderately dilated.   4. The mitral valve is normal in structure. Mild mitral valve  regurgitation. No evidence of mitral stenosis.   5. The aortic valve is normal in structure. Aortic valve regurgitation is  not visualized. No aortic stenosis is present.   6. Aortic dilatation noted. There is moderate dilatation of the aortic  root, measuring 46 mm.   7. The inferior vena cava is dilated in size with <50% respiratory  variability, suggesting right atrial pressure of 15 mmHg.   FINDINGS   Left Ventricle: Left ventricular ejection fraction, by estimation, is 65  to 70%. The left ventricle has normal function. The left ventricle has no  regional wall motion abnormalities. The left ventricular internal cavity  size was normal in size. There is   moderate left ventricular hypertrophy. Left ventricular diastolic  parameters are consistent with Grade II diastolic dysfunction  (pseudonormalization).   Right Ventricle: The right ventricular size is normal. No increase in  right ventricular wall thickness. Right ventricular systolic function is  normal. Tricuspid regurgitation signal is inadequate for assessing PA  pressure.   Left Atrium: Left atrial size was moderately dilated.   Right Atrium: Right atrial size was normal in size.   Pericardium: There is no evidence of pericardial effusion.   Mitral Valve: The mitral valve is normal in structure. Mild mitral valve  regurgitation. No evidence of mitral valve  stenosis.  Tricuspid Valve: The tricuspid valve is normal in structure. Tricuspid  valve regurgitation is trivial. No evidence of tricuspid stenosis.   Aortic Valve: The aortic valve is normal in structure. Aortic valve  regurgitation is not visualized. No aortic stenosis is present.   Pulmonic Valve: The pulmonic valve was normal in structure. Pulmonic valve  regurgitation is trivial. No evidence of pulmonic stenosis.   Aorta: Aortic dilatation noted. There is moderate dilatation of the aortic  root, measuring 46 mm.   Venous: The inferior vena cava is dilated in size with less than 50%  respiratory variability, suggesting right atrial pressure of 15 mmHg.   IAS/Shunts: No atrial level shunt detected by color flow Doppler.     LEFT VENTRICLE  PLAX 2D  LVIDd:         4.40 cm   Diastology  LVIDs:         2.70 cm   LV e' medial:    9.01 cm/s  LV PW:         1.60 cm   LV E/e' medial:  12.0  LV IVS:        1.50 cm   LV e' lateral:   9.32 cm/s  LVOT diam:     2.40 cm   LV E/e' lateral: 11.6  LV SV:         120  LV SV Index:   57  LVOT Area:     4.52 cm     RIGHT VENTRICLE             IVC  RV S prime:     16.20 cm/s  IVC diam: 2.30 cm  TAPSE (M-mode): 2.2 cm   LEFT ATRIUM             Index        RIGHT ATRIUM           Index  LA diam:        4.40 cm 2.11 cm/m   RA Pressure: 3.00 mmHg  LA Vol (A2C):   72.6 ml 34.81 ml/m  RA Area:     16.40 cm  LA Vol (A4C):   87.2 ml 41.81 ml/m  RA Volume:   37.80 ml  18.12 ml/m  LA Biplane Vol: 83.2 ml 39.89 ml/m   AORTIC VALVE  LVOT Vmax:   123.00 cm/s  LVOT Vmean:  77.300 cm/s  LVOT VTI:    0.265 m    AORTA  Ao Root diam: 4.60 cm  Ao Asc diam:  3.60 cm   MITRAL VALVE                TRICUSPID VALVE  MV Area (PHT): 3.17 cm     Estimated RAP:  3.00 mmHg  MV Decel Time: 239 msec  MV E velocity: 108.00 cm/s  SHUNTS  MV A velocity: 63.40 cm/s   Systemic VTI:  0.26 m  MV E/A ratio:  1.70         Systemic Diam: 2.40 cm   Arvilla Meres MD  Electronically signed by Arvilla Meres MD  Signature Date/Time: 04/15/2023/8:56:24 AM        Final       Assessment & Plan:   The patient is a 72 year old male in overall generally good health with a 4.5 cm ascending thoracic aortic aneurysm requiring ongoing surveillance.  We will obtain a chest CTA in 6 months to continue to follow.  We discussed activity and lifestyle activities in terms of good  control of blood pressure, healthy nutrition, exercise, getting sleep and sunshine.  Of note the patient says he is scheduled to go on an elk hunt in the near future and asked if it would be safe from the perspective of his aneurysm.  From that perspective I do feel that would be okay, however due to his significant symptoms related to fatigue and at least some degree of three-vessel coronary artery disease by calcium score it seems reasonable to have cardiology see him again for possible stress test of some type to going on that type of trip where it is 6000 feet elevation and can be somewhat exertional.  We will try to have him seen by cardiology to arrange this.     Problem List Items Addressed This Visit   None Visit Diagnoses     Aneurysm of ascending aorta without rupture (HCC)    -  Primary       No orders of the defined types were placed in this encounter.   No follow-ups on file.  Rowe Clack, PA-C

## 2023-05-29 DIAGNOSIS — R5383 Other fatigue: Secondary | ICD-10-CM | POA: Diagnosis not present

## 2023-05-30 ENCOUNTER — Telehealth: Payer: Self-pay

## 2023-05-30 DIAGNOSIS — R0609 Other forms of dyspnea: Secondary | ICD-10-CM

## 2023-05-30 DIAGNOSIS — Z01810 Encounter for preprocedural cardiovascular examination: Secondary | ICD-10-CM

## 2023-05-30 LAB — PROTEIN ELECTROPHORESIS, SERUM, WITH REFLEX
Albumin ELP: 4 g/dL (ref 3.8–4.8)
Alpha 1: 0.3 g/dL (ref 0.2–0.3)
Alpha 2: 0.5 g/dL (ref 0.5–0.9)
Beta 2: 0.3 g/dL (ref 0.2–0.5)
Beta Globulin: 0.4 g/dL (ref 0.4–0.6)
Gamma Globulin: 0.9 g/dL (ref 0.8–1.7)
Total Protein: 6.3 g/dL (ref 6.1–8.1)

## 2023-05-30 LAB — HEPATITIS C ANTIBODY: Hepatitis C Ab: NONREACTIVE

## 2023-05-30 LAB — CBC WITH DIFFERENTIAL/PLATELET
Absolute Monocytes: 458 {cells}/uL (ref 200–950)
Basophils Absolute: 57 {cells}/uL (ref 0–200)
Basophils Relative: 1.1 %
Eosinophils Absolute: 31 {cells}/uL (ref 15–500)
Eosinophils Relative: 0.6 %
HCT: 40.5 % (ref 38.5–50.0)
Hemoglobin: 14 g/dL (ref 13.2–17.1)
Lymphs Abs: 2298 {cells}/uL (ref 850–3900)
MCH: 33.5 pg — ABNORMAL HIGH (ref 27.0–33.0)
MCHC: 34.6 g/dL (ref 32.0–36.0)
MCV: 96.9 fL (ref 80.0–100.0)
Monocytes Relative: 8.8 %
Neutro Abs: 2356 {cells}/uL (ref 1500–7800)
Neutrophils Relative %: 45.3 %
RBC: 4.18 10*6/uL — ABNORMAL LOW (ref 4.20–5.80)
RDW: 12.6 % (ref 11.0–15.0)
Total Lymphocyte: 44.2 %
WBC: 5.2 10*3/uL (ref 3.8–10.8)

## 2023-05-30 LAB — SEDIMENTATION RATE: Sed Rate: 2 mm/h (ref 0–20)

## 2023-05-30 LAB — HIV ANTIBODY (ROUTINE TESTING W REFLEX): HIV 1&2 Ab, 4th Generation: NONREACTIVE

## 2023-05-30 NOTE — Telephone Encounter (Signed)
-----   Message from Belia Heman Comer sent at 05/30/2023  8:53 AM EDT ----- Please let him know that all his results are normal/negative - hep C, HIV, protein electrophoresis.  No Infectious etiology of his fatigue evident.   No follow up needed.  thanks ----- Message ----- From: Janace Hoard Lab Results In Sent: 05/27/2023  11:34 PM EDT To: Gardiner Barefoot, MD

## 2023-05-30 NOTE — Telephone Encounter (Signed)
Arias, Mike Ping, MD  Mike Mage, RN Cc: [facility] Triage Would it be possible to order a GXT on Mike Arias for next week.  He is leaving on an elk hunt next week and wants a stress test .  If we have no availability here,  see if [facility] ) can do or perhaps the hospital  Thanks  PN  Thanks  Placed order for GXT.

## 2023-05-30 NOTE — Telephone Encounter (Signed)
Spoke with patient, scheduled GXT for Tuesday (06/02/23) at 10:30. Patient confirmed, no questions at this time

## 2023-06-03 ENCOUNTER — Ambulatory Visit: Payer: Medicare Other | Attending: Cardiovascular Disease

## 2023-06-03 ENCOUNTER — Encounter: Payer: Self-pay | Admitting: Gastroenterology

## 2023-06-03 DIAGNOSIS — R0609 Other forms of dyspnea: Secondary | ICD-10-CM

## 2023-06-03 DIAGNOSIS — Z01810 Encounter for preprocedural cardiovascular examination: Secondary | ICD-10-CM | POA: Diagnosis not present

## 2023-06-04 ENCOUNTER — Telehealth: Payer: Self-pay | Admitting: Neurology

## 2023-06-04 ENCOUNTER — Other Ambulatory Visit: Payer: Self-pay

## 2023-06-04 ENCOUNTER — Emergency Department (HOSPITAL_BASED_OUTPATIENT_CLINIC_OR_DEPARTMENT_OTHER)
Admission: EM | Admit: 2023-06-04 | Discharge: 2023-06-04 | Disposition: A | Discharge status: Home / Self Care | Payer: Medicare Other | Attending: Emergency Medicine | Admitting: Emergency Medicine

## 2023-06-04 ENCOUNTER — Encounter (HOSPITAL_BASED_OUTPATIENT_CLINIC_OR_DEPARTMENT_OTHER): Payer: Self-pay | Admitting: Emergency Medicine

## 2023-06-04 DIAGNOSIS — Z8673 Personal history of transient ischemic attack (TIA), and cerebral infarction without residual deficits: Secondary | ICD-10-CM | POA: Diagnosis not present

## 2023-06-04 DIAGNOSIS — R5383 Other fatigue: Secondary | ICD-10-CM | POA: Diagnosis not present

## 2023-06-04 DIAGNOSIS — R55 Syncope and collapse: Secondary | ICD-10-CM | POA: Insufficient documentation

## 2023-06-04 DIAGNOSIS — Z7982 Long term (current) use of aspirin: Secondary | ICD-10-CM | POA: Diagnosis not present

## 2023-06-04 DIAGNOSIS — R42 Dizziness and giddiness: Secondary | ICD-10-CM | POA: Insufficient documentation

## 2023-06-04 DIAGNOSIS — E860 Dehydration: Secondary | ICD-10-CM | POA: Diagnosis not present

## 2023-06-04 LAB — COMPREHENSIVE METABOLIC PANEL
ALT: 19 U/L (ref 0–44)
AST: 17 U/L (ref 15–41)
Albumin: 4 g/dL (ref 3.5–5.0)
Alkaline Phosphatase: 51 U/L (ref 38–126)
Anion gap: 8 (ref 5–15)
BUN: 19 mg/dL (ref 8–23)
CO2: 24 mmol/L (ref 22–32)
Calcium: 9.6 mg/dL (ref 8.9–10.3)
Chloride: 105 mmol/L (ref 98–111)
Creatinine, Ser: 0.97 mg/dL (ref 0.61–1.24)
GFR, Estimated: >60 mL/min (ref >60)
Glucose, Bld: 102 mg/dL — ABNORMAL HIGH (ref 70–99)
Potassium: 4 mmol/L (ref 3.5–5.1)
Sodium: 137 mmol/L (ref 135–145)
Total Bilirubin: 0.5 mg/dL (ref 0.3–1.2)
Total Protein: 6.5 g/dL (ref 6.5–8.1)

## 2023-06-04 LAB — EXERCISE TOLERANCE TEST
Angina Index: 0
Base ST Depression (mm): 0 mm
Base ST Elevation (mm): 0 mm
Estimated workload: 8.9
Exercise duration (min): 8 min
Exercise duration (sec): 0 s
MPHR: 150 {beats}/min
Peak HR: 129 {beats}/min
Percent HR: 86 %
RPE: 16
Rest HR: 55 {beats}/min

## 2023-06-04 LAB — CBC WITH DIFFERENTIAL/PLATELET
Abs Immature Granulocytes: 0.01 10*3/uL (ref 0.00–0.07)
Basophils Absolute: 0 10*3/uL (ref 0.0–0.1)
Basophils Relative: 1 %
Eosinophils Absolute: 0.1 10*3/uL (ref 0.0–0.5)
Eosinophils Relative: 2 %
HCT: 40.2 % (ref 39.0–52.0)
Hemoglobin: 13.7 g/dL (ref 13.0–17.0)
Immature Granulocytes: 0 %
Lymphocytes Relative: 57 %
Lymphs Abs: 2.4 10*3/uL (ref 0.7–4.0)
MCH: 32.9 pg (ref 26.0–34.0)
MCHC: 34.1 g/dL (ref 30.0–36.0)
MCV: 96.6 fL (ref 80.0–100.0)
Monocytes Absolute: 0.5 10*3/uL (ref 0.1–1.0)
Monocytes Relative: 11 %
Neutro Abs: 1.3 10*3/uL — ABNORMAL LOW (ref 1.7–7.7)
Neutrophils Relative %: 29 %
Platelets: 111 10*3/uL — ABNORMAL LOW (ref 150–400)
RBC: 4.16 MIL/uL — ABNORMAL LOW (ref 4.22–5.81)
RDW: 14.2 % (ref 11.5–15.5)
WBC: 4.4 10*3/uL (ref 4.0–10.5)
nRBC: 0 % (ref 0.0–0.2)

## 2023-06-04 LAB — TSH: TSH: 1.543 u[IU]/mL (ref 0.350–4.500)

## 2023-06-04 LAB — MAGNESIUM: Magnesium: 1.9 mg/dL (ref 1.7–2.4)

## 2023-06-04 LAB — TROPONIN I (HIGH SENSITIVITY): Troponin I (High Sensitivity): 5 ng/L (ref <18)

## 2023-06-04 MED ORDER — SODIUM CHLORIDE 0.9 % IV BOLUS
1000.0000 mL | Freq: Once | INTRAVENOUS | Status: AC
  Administered 2023-06-04: 1000 mL via INTRAVENOUS

## 2023-06-04 NOTE — Discharge Instructions (Signed)
Your history, exam, workup today are consistent with a near syncopal or near passing out episode likely mid to dehydration in the setting of your recent fatigue.  Your electrolytes and labs were overall reassuring.  Your heart enzyme was negative.  Your thyroid test was normal.  Your EKG appeared similar to prior.  We monitored you here for over 2.5 hours without recurrent episodes lightheadedness, near syncope, or dizziness.  You were able to tolerate the IV fluids and then able to ambulate around without feeling lightheaded.  We feel you are safe for discharge home to follow-up with your primary doctor tomorrow.  Please try to over hydrate and rest over the next few days.  If any symptoms recur, change, or worsen acutely, please return to the nearest emergency department.

## 2023-06-04 NOTE — ED Provider Notes (Signed)
[name] EMERGENCY DEPARTMENT AT [facility] Provider Note   CSN: [id number] Arrival date & time: 06/04/23  1808     History  Chief Complaint  Patient presents with   Dizziness    Mike Arias is a 72 y.o. male.  The history is provided by the patient and medical records. No language interpreter was used.  Dizziness Quality:  Lightheadedness Severity:  Moderate Onset quality:  Gradual Duration:  1 day Timing:  Sporadic Progression:  Partially resolved Chronicity:  New Context: standing up   Relieved by:  Lying down Worsened by:  Standing up Ineffective treatments:  None tried Associated symptoms: no chest pain, no diarrhea, no headaches, no nausea, no palpitations, no shortness of breath, no syncope, no vision changes, no vomiting and no weakness   Risk factors: no hx of vertigo        Home Medications Prior to Admission medications   Medication Sig Start Date End Date Taking? Authorizing Provider  AMBIEN 5 MG tablet 1 tablet at bedtime as needed Orally Once a day for 30 days Patient not taking: Reported on 05/28/2023 03/25/23   [provider]  Aspirin 81 MG CAPS Take 325 mg by mouth daily. Patient not taking: Reported on 05/27/2023    [provider]  B Complex-C (VITAMIN B + C COMPLEX PO) Take 1 tablet by mouth daily.    [provider]  Cyanocobalamin (VITAMIN B12) 1000 MCG TBCR 1 tablet Orally Once a day for 30 day(s)    [provider]  doxycycline (VIBRA-TABS) 100 MG tablet Take 100 mg by mouth 2 (two) times daily. Patient not taking: Reported on 05/28/2023 05/20/23   [provider]  Multiple Vitamins-Minerals (ONE-A-DAY MENS 50+ ADVANTAGE) TABS take one tablet by mouth everyday Oral 09/30/19   [provider]  rosuvastatin (CRESTOR) 10 MG tablet TAKE ONE TABLET BY MOUTH DAILY 03/28/20   Nahser, Deloris Ping, MD  tadalafil (CIALIS) 10 MG tablet Take 10 mg by mouth daily as needed. For erectile dysfunction 30-45  minutes prior to sexual activity    [provider]  tamsulosin (FLOMAX) 0.4 MG CAPS capsule 1 capsule, cautioned with dizziness with standing Orally Once a day for 30 day(s)    [provider]  valACYclovir (VALTREX) 500 MG tablet Take 500 mg by mouth daily as needed.    [provider]      Allergies    Patient has no known allergies.    Review of Systems   Review of Systems  Constitutional:  Positive for fatigue. Negative for chills and fever.  HENT:  Negative for congestion.   Eyes:  Negative for visual disturbance.  Respiratory:  Negative for cough, chest tightness, shortness of breath and wheezing.   Cardiovascular:  Negative for chest pain, palpitations, leg swelling and syncope.  Gastrointestinal:  Negative for abdominal pain, constipation, diarrhea, nausea and vomiting.  Genitourinary:  Negative for dysuria and flank pain.  Musculoskeletal:  Negative for back pain, neck pain and neck stiffness.  Skin:  Negative for rash and wound.  Neurological:  Positive for light-headedness. Negative for dizziness, seizures, syncope, facial asymmetry, speech difficulty, weakness, numbness and headaches.  Psychiatric/Behavioral:  Negative for agitation and confusion.   All other systems reviewed and are negative.   Physical Exam Updated Vital Signs BP (!) 115/103   Pulse 67   Temp 98.5 F (36.9 C) (Oral)   Resp 20   SpO2 97%  Physical Exam Vitals and nursing note reviewed.  Constitutional:  General: He is not in acute distress.    Appearance: He is well-developed. He is not ill-appearing, toxic-appearing or diaphoretic.  HENT:     Head: Normocephalic and atraumatic.     Nose: No congestion or rhinorrhea.     Mouth/Throat:     Mouth: Mucous membranes are dry.     Pharynx: No oropharyngeal exudate or posterior oropharyngeal erythema.  Eyes:     Extraocular Movements: Extraocular movements intact.     Conjunctiva/sclera: Conjunctivae normal.      Pupils: Pupils are equal, round, and reactive to light.  Cardiovascular:     Rate and Rhythm: Normal rate and regular rhythm.     Pulses: Normal pulses.     Heart sounds: No murmur heard. Pulmonary:     Effort: Pulmonary effort is normal. No respiratory distress.     Breath sounds: Normal breath sounds. No wheezing, rhonchi or rales.  Chest:     Chest wall: No tenderness.  Abdominal:     General: Abdomen is flat.     Palpations: Abdomen is soft.     Tenderness: There is no abdominal tenderness.  Musculoskeletal:        General: No swelling or tenderness.     Cervical back: Neck supple. No tenderness.     Right lower leg: No edema.     Left lower leg: No edema.  Skin:    General: Skin is warm and dry.     Capillary Refill: Capillary refill takes less than 2 seconds.     Findings: No erythema or rash.  Neurological:     General: No focal deficit present.     Mental Status: He is alert.     Sensory: No sensory deficit.     Motor: No weakness.  Psychiatric:        Mood and Affect: Mood normal.     ED Results / Procedures / Treatments   Labs (all labs ordered are listed, but only abnormal results are displayed) Labs Reviewed  CBC WITH DIFFERENTIAL/PLATELET - Abnormal; Notable for the following components:      Result Value   RBC 4.16 (*)    Platelets 111 (*)    Neutro Abs 1.3 (*)    All other components within normal limits  COMPREHENSIVE METABOLIC PANEL - Abnormal; Notable for the following components:   Glucose, Bld 102 (*)    All other components within normal limits  TSH  MAGNESIUM  TROPONIN I (HIGH SENSITIVITY)    EKG None  Radiology Exercise Tolerance Test  Result Date: 06/04/2023   Pt walked for 8 minutes of a standard Bruce protocol GXT.   He achieved a peak HR of 129 which is 86% or predicted maximal HR.   At peak exerccise, he had no ST or T wave changes to suggest ischemia.   His BP response to exercise was normal . No significant arrhythmias during the  GXT Normal GXT He is at low risk for [place] Pt walked for 8 minutes of a standard Bruce protocol GXT.   He achieved a peak HR of 129 which is 86% or predicted maximal HR.   At peak exerccise, he had no ST or T wave changes to suggest ischemia.   His BP response to exercise was normal . No significant arrhythmias during the GXT Normal GXT He is at low risk for his upcoming [place]    Procedures Procedures    Medications Ordered  in ED Medications  sodium chloride 0.9 % bolus 1,000 mL (0 mLs Intravenous Stopped 06/04/23 2109)    ED Course/ Medical Decision Making/ A&P                                 Medical Decision Making Amount and/or Complexity of Data Reviewed Labs: ordered.    Mike Arias is a 72 y.o. male with a past medical history significant for GERD, hyperlipidemia, previous TIA, and aortic root dilation who has had 6 months of profound fatigue who presents with near syncopal episode and lightheadedness today.  According to patient, he had a stress test yesterday that he reports he did well with and has been doing more active things over the last few days.  He [place] and was exerting himself and then after sitting down, felt fatigued.  He reports he tried to stand up several times and then got very lightheaded and had to lay back down.  He reports no true syncope and denied any associated symptoms such as chest pain, palpitations, shortness of breath, nausea, vomiting, constipation, diarrhea, or urinary changes.  He reports he did not actually pass out but multiple times had about 15 seconds of lightheadedness and near syncope when he tried to get up.  He reports he called family to bring him over here for evaluation.  He says that he drinks a lot of coffee and has not had as much fluids recently he thinks.    Otherwise, he reports he is in the midst of a long workup to determine the  etiology of this fatigue that has been plaguing him for the last few months.  He has seen infectious ease as he has intermittent fevers and has been seeing other specialist as well.  He reports that the "dizziness" that he was having earlier was actually lightheadedness and near syncope and was not room spinning.  He denies any focal numbness, tingling, or focal weakness.  Denies any speech or vision changes.  Denies any facial droop.  Reports she is feeling somewhat better now and thinks he overreacted coming here.  On exam, lungs were clear.  Chest is nontender.  No murmur on my exam.  Abdomen nontender.  Good pulses in all extremities intact sensation and strength in extremities.  Normal finger-nose-finger testing.  Symmetric smile.  Clear speech.  Pupils are symmetric and reactive with normal extract movements.  No carotid bruit on my exam.  Patient had some mildly dry mucous membranes.  EKG does not show STEMI.  He did have a PVC and has PVCs on the monitor but is not reporting palpitations.  Clinically I suspect he is dehydrated and had a near syncopal episode in the setting of this.  Will give some fluids.  I will get some labs however and we will get a troponin given the stress test he had yesterday and the near syncopal episode today.  Will also check TSH I does not appear to been checked in about a year and other labs.  As he recently had a CT scan and is denying any chest pain or shortness of breath we will hold on chest x-ray.  Given his lack of focal neurologic deficits and this being more of a lightheadedness, without headache or nausea or vomiting or vision changes, will hold on imaging of the head at this time.  He reports he has a  PCP appointment at 8:30 in the morning, if workup reassuring and he is feeling better after fluids, anticipate discharge home.     8:49 PM Workup returned overall reassuring.  After fluids patient is continue to feel well.  He was able to get up to the  bathroom without any lightheadedness or near syncope.  He thinks he may have been dehydrated.  Given reassuring workup including normal troponin, normal TSH, and otherwise reassuring electrolytes, I feel he is safe for discharge home.  Patient agrees and will go see his PCP in the morning.  Then with questions or concerns and patient discharged in good condition after rehydration.         Final Clinical Impression(s) / ED Diagnoses Final diagnoses:  Near syncope  Episodic lightheadedness    Clinical Impression: 1. Near syncope   2. Episodic lightheadedness     Disposition: Discharge  Condition: Good  I have discussed the results, Dx and Tx plan with the pt(& family if present). He/she/they expressed understanding and agree(s) with the plan. Discharge instructions discussed at great length. Strict return precautions discussed and pt &/or family have verbalized understanding of the instructions. No further questions at time of discharge.    Discharge Medication List as of 06/04/2023  8:52 PM      Follow Up: your PCP tomorrow morning        Mahati Vajda, Canary Brim, MD 06/04/23 2209

## 2023-06-04 NOTE — Telephone Encounter (Signed)
I returned pt's call regarding this new issue with dizziness he is having and him wanting to schedule an appointment. I told pt that because this is a new problem and he has not discussed this with our providers previously, he would need to see his PCP first to be evaluated so they can rule out any other issues that might be going on and if they felt that this is a neurological problem then they would need to send [place] a referral and we would gladly see him. At first pt seemed compliant and agreeable with this suggestion and then while I was in middle of conversation he just hung up on me.

## 2023-06-04 NOTE — ED Triage Notes (Signed)
Dizziness/ room spinning when standing up today. Improved when laying back down Home BP 105/75 hr 73 prior to coming in. Increased fatigue for last 6 months.

## 2023-06-04 NOTE — Telephone Encounter (Signed)
Patient called stating he is a patient of Dr. Marlis Edelson and has been having some dizziness when he stands up. I reviewed patient's chart and we have never discussed this before with him. I let him know we would need a new referral for that issue because we have never seen him here for that before. He said why do I need a referral? Dr. Pearlean Brownie is my doctor I should be able to make an appointment. I explained that since this is a new issue you need to be worked up prior to [place] getting you in here, usually that is with your PCP, but can also be another provider sometimes if you have discussed this with anyone else. He said he had a stress test recently. I reviewed those notes with cardiology and they did not discuss any dizziness. I asked if he has seen his PCP for this he said no Dr. Pearlean Brownie is my doctor and I am referring myself and I am a friend of Dr. Trevor Mace and I don't understand all this referral stuff I just need to make an appointment. I again let him know we would need a referral for him since this could potentially not be a neurological issue as he said he has dizziness only when he stands up and right now our availability is months out for a visit so I would really recommend you get in to see your PCP for this first to see if they can rule out something non-neurological causing this. He disagreed and said he wants to make an appointment, I let him know I will send to your provider and see what they recommend as I am just following our office policy. Please advise can someone give him a call back?

## 2023-06-05 DIAGNOSIS — R058 Other specified cough: Secondary | ICD-10-CM | POA: Diagnosis not present

## 2023-06-05 DIAGNOSIS — R5383 Other fatigue: Secondary | ICD-10-CM | POA: Diagnosis not present

## 2023-06-17 DIAGNOSIS — D696 Thrombocytopenia, unspecified: Secondary | ICD-10-CM | POA: Diagnosis not present

## 2023-06-24 DIAGNOSIS — K080 Exfoliation of teeth due to systemic causes: Secondary | ICD-10-CM | POA: Diagnosis not present

## 2023-06-29 ENCOUNTER — Encounter: Payer: Self-pay | Admitting: Cardiovascular Disease

## 2023-06-29 NOTE — Progress Notes (Unsigned)
This encounter was created in error - please disregard.

## 2023-06-30 ENCOUNTER — Ambulatory Visit: Payer: Medicare Other | Attending: Cardiovascular Disease | Admitting: Cardiovascular Disease

## 2023-07-01 ENCOUNTER — Encounter: Payer: Self-pay | Admitting: Cardiovascular Disease

## 2023-07-03 ENCOUNTER — Telehealth: Payer: Self-pay | Admitting: *Deleted

## 2023-07-03 NOTE — Telephone Encounter (Signed)
Transition Care Management Unsuccessful Follow-up Telephone Call  Date of discharge and from where:  [facility]  06/04/2023  Attempts:  1st attempt  Reason for unsuccessful TCM follow-up call:  Left voice message

## 2023-08-25 ENCOUNTER — Ambulatory Visit: Payer: Medicare Other | Admitting: Cardiovascular Disease

## 2023-09-02 DIAGNOSIS — M170 Bilateral primary osteoarthritis of knee: Secondary | ICD-10-CM | POA: Diagnosis not present

## 2023-09-11 DIAGNOSIS — M170 Bilateral primary osteoarthritis of knee: Secondary | ICD-10-CM | POA: Diagnosis not present

## 2023-09-16 DIAGNOSIS — M170 Bilateral primary osteoarthritis of knee: Secondary | ICD-10-CM | POA: Diagnosis not present

## 2023-10-15 DIAGNOSIS — J09X2 Influenza due to identified novel influenza A virus with other respiratory manifestations: Secondary | ICD-10-CM | POA: Diagnosis not present

## 2023-10-15 DIAGNOSIS — R509 Fever, unspecified: Secondary | ICD-10-CM | POA: Diagnosis not present

## 2023-10-28 ENCOUNTER — Ambulatory Visit (INDEPENDENT_AMBULATORY_CARE_PROVIDER_SITE_OTHER): Payer: Medicare Other

## 2023-10-28 ENCOUNTER — Ambulatory Visit: Payer: Medicare Other | Attending: Cardiovascular Disease | Admitting: Cardiovascular Disease

## 2023-10-28 ENCOUNTER — Encounter: Payer: Self-pay | Admitting: Cardiovascular Disease

## 2023-10-28 VITALS — BP 102/60 | HR 86 | Ht 73.0 in | Wt 196.0 lb

## 2023-10-28 DIAGNOSIS — R002 Palpitations: Secondary | ICD-10-CM

## 2023-10-28 DIAGNOSIS — I4891 Unspecified atrial fibrillation: Secondary | ICD-10-CM

## 2023-10-28 MED ORDER — APIXABAN 5 MG PO TABS: 5.0000 mg | ORAL_TABLET | Freq: Two times a day (BID) | ORAL | 3 refills | Status: DC

## 2023-10-28 MED ORDER — APIXABAN 5 MG PO TABS: 5.0000 mg | ORAL_TABLET | Freq: Two times a day (BID) | ORAL | 0 refills | Status: DC

## 2023-10-28 NOTE — Progress Notes (Signed)
 Cardiology Office Note:    Date:  10/28/2023   ID:  Mike Arias, DOB 10-08-1950, MRN [id number]  PCP:  Valentin Skates, DO  Cardiologist:  Aleene Passe, MD  Electrophysiologist:  None   Referring MD: Lucas Dorise POUR, MD   Chief Complaint  Patient presents with   Hyperlipidemia   Shortness of Breath          December 07, 2018    Mike Arias is a 73 y.o. male with a recent  hx of a TIA. He is self referred ( friend and neighbor of Lael Cooler, MD)   Mike Arias had an episode of inability to speak correctly  1 week ago The symptoms lasted 15 minutes - 30 min  and have not returned No weakness or numbness A few min of blurry vision No Cp , no dyspnea.    Lipids are elevated.   Checks his BP at the [facility] ,  Is occasionally elevated  Does not eat much salt  On no meds   Swam his 1.25 miles the next day without any issues   CT of the head was negative.  Carotid duplex revealed mild plaque.  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     Went to [facility].  Swam  stuideied busienss   April 06, 2019   Mike Arias is seen today for follow up of his TIA  Monitor showed NSR Echo shows nomal LV function.  Mild Ao dilitation    March 29, 2022 Mike Arias is seen for follow up of a TIA I last saw him int 2020 Event monitor showed NSR.   Echo shows normal LV function, mild aortic dilatation  Mike Arias has an episodes of aphasia in Jan. 2023 He went to the ER but within 15 min, his symptoms resolved.  MRA of the neck shoed a 50 vertebral stensis  He was seen by Dr. Rosemarie.      Mike Arias is concerned about a friend who recently died of CAD and wanted to be evaluated   Very active.  No CP    March 24, 2023  Mike Arias is seen today for follow up of a TIA He has been having recent episodes of fatigue, DOE ,? Chest tighness  Has been trying to lose some weight  Has been trying Tirzepatide for weight loss  Has not been feeling well about 1 month after starting   Last Sunday,  he developed some additional  chest soreness, some dyspnea ,  lots of fatigue  and went to the ER  Work up was negative  WBC was 2.8   Denies any chest pressure  No neuro issues to suggest another TIA  No heart palpitations to suggest afib or other arrhythmias      Has lost 10-12 lbs   This morning , he felt as well as he has in the past 2 months   Is leaving for [place] / [place] next week .   ? Echo ? Cor CTA  Feb. 4, 2025 Mike Arias is seen for follow up of his DOE, HLD ,  mild dilatation of his aorta  Is exercising some  Swims 1-2 times a week , plays golf periodically   He is in atrial fibrillation today on exam.  He cannot feel that his heart is beating irregularly.  He has a history of a TIA in the past.       Past Medical History:  Diagnosis Date   GERD (gastroesophageal reflux disease)  HLD (hyperlipidemia)    TIA (transient ischemic attack)    ? TIA    Past Surgical History:  Procedure Laterality Date   CERVICAL SPINE SURGERY     COLONOSCOPY     ESOPHAGOGASTRODUODENOSCOPY ENDOSCOPY     TONSILLECTOMY      Current Medications: Current Meds  Medication Sig   AMBIEN 5 MG tablet    apixaban  (ELIQUIS ) 5 MG TABS tablet Take 1 tablet (5 mg total) by mouth 2 (two) times daily.   apixaban  (ELIQUIS ) 5 MG TABS tablet Take 1 tablet (5 mg total) by mouth 2 (two) times daily.   B Complex-C (VITAMIN B + C COMPLEX PO) Take 1 tablet by mouth daily.   Cyanocobalamin  (VITAMIN B12) 1000 MCG TBCR 1 tablet Orally Once a day for 30 day(s)   doxycycline (VIBRA-TABS) 100 MG tablet Take 100 mg by mouth 2 (two) times daily.   Multiple Vitamins-Minerals (ONE-A-DAY MENS 50+ ADVANTAGE) TABS take one tablet by mouth everyday Oral   rosuvastatin  (CRESTOR ) 10 MG tablet TAKE ONE TABLET BY MOUTH DAILY   tadalafil  (CIALIS ) 10 MG tablet Take 10 mg by mouth daily as needed. For erectile dysfunction 30-45 minutes prior to sexual activity   tamsulosin (FLOMAX) 0.4 MG CAPS capsule 1 capsule, cautioned with dizziness with  standing Orally Once a day for 30 day(s)   valACYclovir (VALTREX) 500 MG tablet Take 500 mg by mouth daily as needed.   [DISCONTINUED] Aspirin 81 MG CAPS Take 325 mg by mouth daily.     Allergies:   Patient has no known allergies.   Social History   Socioeconomic History   Marital status: Married    Spouse name: Not on file   Number of children: Not on file   Years of education: Not on file   Highest education level: Not on file  Occupational History   Not on file  Tobacco Use   Smoking status: Never   Smokeless tobacco: Never   Tobacco comments:    maybe a cigar on occasion  Vaping Use   Vaping status: Never Used  Substance and Sexual Activity   Alcohol  use: Yes    Comment: 4 days a week   Drug use: No   Sexual activity: Not on file  Other Topics Concern   Not on file  Social History Narrative   Not on file   Social Drivers of Health   Financial Resource Strain: Not on file  Food Insecurity: Not on file  Transportation Needs: Not on file  Physical Activity: Not on file  Stress: Not on file  Social Connections: Not on file     Family History: The patient's family history includes Lung cancer in his father. There is no history of Colon cancer, Esophageal cancer, Stomach cancer, Rectal cancer, or Colon polyps.  ROS:   Please see the history of present illness.     All other systems reviewed and are negative.  EKGs/Labs/Other Studies Reviewed:    The following studies were reviewed today:   EKG:     EKG Interpretation Date/Time:  Tuesday October 28 2023 16:57:39 EST Ventricular Rate:  97 PR Interval:    QRS Duration:  92 QT Interval:  346 QTC Calculation: 439 R Axis:   7  Text Interpretation: Atrial fibrillation Anteroseptal infarct , age undetermined When compared with ECG of 04-Jun-2023 18:28, atrial fib is new since previous ecg Confirmed by Alveta Mungo [phone]) on 10/28/2023 5:14:49 PM    Recent Labs: 06/04/2023: ALT 19; BUN 19; Creatinine,  Ser  0.97; Hemoglobin 13.7; Magnesium 1.9; Platelets 111; Potassium 4.0; Sodium 137; TSH 1.543  Recent Lipid Panel    Component Value Date/Time   CHOL 144 11/05/2022 0842   TRIG 78 11/05/2022 0842   HDL 46 11/05/2022 0842   CHOLHDL 3.1 11/05/2022 0842   LDLCALC 83 11/05/2022 0842     Physical Exam: Blood pressure 102/60, pulse 86, height 6' 1 (1.854 m), weight 196 lb (88.9 kg), SpO2 97%.       GEN:  Well nourished, well developed in no acute distress HEENT: Normal NECK: No JVD; No carotid bruits LYMPHATICS: No lymphadenopathy CARDIAC: irreg. Irreg.  RESPIRATORY:  Clear to auscultation without rales, wheezing or rhonchi  ABDOMEN: Soft, non-tender, non-distended MUSCULOSKELETAL:  No edema; No deformity  SKIN: Warm and dry NEUROLOGIC:  Alert and oriented x 3     ASSESSMENT:    1. Palpitations   2. Atrial fibrillation, unspecified type (HCC)      PLAN:      1.  Atrial fibrillation: Mike Arias is completely asymptomatic at but had an irregularly irregular rhythm on exam.  EKG confirms that he is in atrial fibrillation.  He is 73 years old, has had a TIA in the past. TIA - history of TIA.     He snores but does not necessarily think that he has sleep apnea.  We may need to test for that in the future.  We will start him on Eliquis  5 mg twice a day, discontinue aspirin  It is not clear to me whether he has paroxysmal atrial fibrillation or is in persistent atrial fibrillation.  Will place a 14-day monitor to help us  sort that out.  Will have him return to see the atrial fibrillation clinic in approximately 4 weeks. If he is in persistent atrial fibrillation then we should refer him for DC cardioversion. If he has paroxysmal atrial fibrillation then he should be considered for an antiarrhythmic to help try to maintain sinus rhythm.   2.  Fatigue/shortness of breath:   no change in symptoms     2. Hyperlipidemia :        3.  Remote hx of smoking:         Medication  Adjustments/Labs and Tests Ordered: Current medicines are reviewed at length with the patient today.  Concerns regarding medicines are outlined above.  Orders Placed This Encounter  Procedures   CT ANGIO CHEST AORTA W/ & OR WO/CM & GATING ([facility] ONLY)   Basic metabolic panel   Amb Referral to AFIB Clinic   LONG TERM MONITOR (3-14 DAYS)   EKG 12-Lead   ECHOCARDIOGRAM COMPLETE   Meds ordered this encounter  Medications   apixaban  (ELIQUIS ) 5 MG TABS tablet    Sig: Take 1 tablet (5 mg total) by mouth 2 (two) times daily.    Dispense:  180 tablet    Refill:  3   apixaban  (ELIQUIS ) 5 MG TABS tablet    Sig: Take 1 tablet (5 mg total) by mouth 2 (two) times daily.    Dispense:  56 tablet    Refill:  0    Lot Number?:   [id number]    Expiration Date?:   10/23/2024    Quantity:   56    Patient Instructions  Labs: BMET  Medication Instructions:  Start Eliquis  5 mg twice daily Stop Aspirin *If you need a refill on your cardiac medications before your next appointment, please call your pharmacy*   Testing/Procedures: CTA of Aorta in  1 year Your physician has requested that you have cardiac CT of the Aorta. Cardiac computed tomography (CT) is a painless test that uses an x-ray machine to take clear, detailed pictures of your heart. For further information please visit [email or web address] Please follow instruction sheet as given.  ECHO Your physician has requested that you have an echocardiogram. Echocardiography is a painless test that uses sound waves to create images of your heart. It provides your doctor with information about the size and shape of your heart and how well your heart's chambers and valves are working. This procedure takes approximately one hour. There are no restrictions for this procedure. Please do NOT wear cologne, perfume, aftershave, or lotions (deodorant is allowed). Please arrive 15 minutes prior to your appointment time.  Please note: We ask at that you  not bring children with you during ultrasound (echo/ vascular) testing. Due to room size and safety concerns, children are not allowed in the ultrasound rooms during exams. Our front office staff cannot provide observation of children in our lobby area while testing is being conducted. An adult accompanying a patient to their appointment will only be allowed in the ultrasound room at the discretion of the ultrasound technician under special circumstances. We apologize for any inconvenience.   Zio Heart Monitor Your physician has requested that you wear a Zio heart monitor for 14 days. This will be mailed to your home with instructions on how to apply the monitor and how to return it when finished. Please allow 2 weeks after returning the heart monitor before our office calls you with the results.   Follow-Up: At [facility] you and your health needs are our priority.  As part of our continuing mission to provide you with exceptional heart care, we have created designated Provider Care Teams.  These Care Teams include your primary Cardiologist (physician) and Advanced Practice Providers (APPs -  Physician Assistants and Nurse Practitioners) who all work together to provide you with the care you need, when you need it.  We recommend signing up for the patient portal called MyChart.  Sign up information is provided on this After Visit Summary.  MyChart is used to connect with patients for Virtual Visits (Telemedicine).  Patients are able to view lab/test results, encounter notes, upcoming appointments, etc.  Non-urgent messages can be sent to your provider as well.   To learn more about what you can do with MyChart, go to [email or web address].    Your next appointment:   3 month(s)  Provider:   Aleene Passe, MD     Other Instructions You have been referred to our Afib clinic. They will reach out to you to make an appointment.  ZIO XT- Long Term Monitor Instructions  Your  physician has requested you wear a ZIO patch monitor for 14 days.  This is a single patch monitor. [employer] supplies one patch monitor per enrollment. Additional stickers are not available. Please do not apply patch if you will be having a Nuclear Stress Test,  Echocardiogram, Cardiac CT, MRI, or Chest Xray during the period you would be wearing the  monitor. The patch cannot be worn during these tests. You cannot remove and re-apply the  ZIO XT patch monitor.  Your ZIO patch monitor will be mailed 3 day USPS to your address on file. It may take 3-5 days  to receive your monitor after you have been enrolled.  Once you have received your monitor, please review the enclosed instructions. Your monitor  has  already been registered assigning a specific monitor serial # to you.  Billing and Patient Assistance Program Information  We have supplied [employer] with any of your insurance information on file for billing purposes. [employer] offers a sliding scale Patient Assistance Program for patients that do not have  insurance, or whose insurance does not completely cover the cost of the ZIO monitor.  You must apply for the Patient Assistance Program to qualify for this discounted rate.  To apply, please call [employer] at [phone], select option 4, select option 2, ask to apply for  Patient Assistance Program. Meredeth will ask your household income, and how many people  are in your household. They will quote your out-of-pocket cost based on that information.  [employer] will also be able to set up a 39-month, interest-free payment plan if needed.  Applying the monitor   Shave hair from upper left chest.  Hold abrader disc by orange tab. Rub abrader in 40 strokes over the upper left chest as  indicated in your monitor instructions.  Clean area with 4 enclosed alcohol  pads. Let dry.  Apply patch as indicated in monitor instructions. Patch will be placed under collarbone on left  side of chest with arrow  pointing upward.  Rub patch adhesive wings for 2 minutes. Remove white label marked 1. Remove the white  label marked 2. Rub patch adhesive wings for 2 additional minutes.  While looking in a mirror, press and release button in center of patch. A small green light will  flash 3-4 times. This will be your only indicator that the monitor has been turned on.  Do not shower for the first 24 hours. You may shower after the first 24 hours.  Press the button if you feel a symptom. You will hear a small click. Record Date, Time and  Symptom in the Patient Logbook.  When you are ready to remove the patch, follow instructions on the last 2 pages of Patient  Logbook. Stick patch monitor onto the last page of Patient Logbook.  Place Patient Logbook in the blue and white box. Use locking tab on box and tape box closed  securely. The blue and white box has prepaid postage on it. Please place it in the mailbox as  soon as possible. Your physician should have your test results approximately 7 days after the  monitor has been mailed back to [facility].  Call [facility] Customer Care at [phone] if you have questions regarding  your ZIO XT patch monitor. Call them immediately if you see an orange light blinking on your  monitor.  If your monitor falls off in less than 4 days, contact our Monitor department at [phone].  If your monitor becomes loose or falls off after 4 days call [employer] at [phone] for  suggestions on securing your monitor        1st Floor: - Lobby - Registration  - Pharmacy  - Lab - Cafe  2nd Floor: - PV Lab - Diagnostic Testing (echo, CT, nuclear med)  3rd Floor: - Vacant  4th Floor: - TCTS (cardiothoracic surgery) - AFib Clinic - Structural Heart Clinic - Vascular Surgery  - Vascular Ultrasound  5th Floor: - HeartCare Cardiology (general and EP) - Clinical Pharmacy for coumadin, hypertension, lipid, weight-loss medications, and med  management appointments    Valet parking services will be available as well.     Signed, Aleene Passe, MD  10/28/2023 5:51 PM    [facility]

## 2023-10-28 NOTE — Patient Instructions (Addendum)
 Labs: BMET  Medication Instructions:  Start Eliquis  5 mg twice daily Stop Aspirin *If you need a refill on your cardiac medications before your next appointment, please call your pharmacy*   Testing/Procedures: CTA of Aorta in 1 year Your physician has requested that you have cardiac CT of the Aorta. Cardiac computed tomography (CT) is a painless test that uses an x-ray machine to take clear, detailed pictures of your heart. For further information please visit [email or web address] Please follow instruction sheet as given.  ECHO Your physician has requested that you have an echocardiogram. Echocardiography is a painless test that uses sound waves to create images of your heart. It provides your doctor with information about the size and shape of your heart and how well your heart's chambers and valves are working. This procedure takes approximately one hour. There are no restrictions for this procedure. Please do NOT wear cologne, perfume, aftershave, or lotions (deodorant is allowed). Please arrive 15 minutes prior to your appointment time.  Please note: We ask at that you not bring children with you during ultrasound (echo/ vascular) testing. Due to room size and safety concerns, children are not allowed in the ultrasound rooms during exams. Our front office staff cannot provide observation of children in our lobby area while testing is being conducted. An adult accompanying a patient to their appointment will only be allowed in the ultrasound room at the discretion of the ultrasound technician under special circumstances. We apologize for any inconvenience.   Zio Heart Monitor Your physician has requested that you wear a Zio heart monitor for 14 days. This will be mailed to your home with instructions on how to apply the monitor and how to return it when finished. Please allow 2 weeks after returning the heart monitor before our office calls you with the results.   Follow-Up: At [facility], you and your health needs are our priority.  As part of our continuing mission to provide you with exceptional heart care, we have created designated Provider Care Teams.  These Care Teams include your primary Cardiologist (physician) and Advanced Practice Providers (APPs -  Physician Assistants and Nurse Practitioners) who all work together to provide you with the care you need, when you need it.  We recommend signing up for the patient portal called MyChart.  Sign up information is provided on this After Visit Summary.  MyChart is used to connect with patients for Virtual Visits (Telemedicine).  Patients are able to view lab/test results, encounter notes, upcoming appointments, etc.  Non-urgent messages can be sent to your provider as well.   To learn more about what you can do with MyChart, go to [email or web address].    Your next appointment:   3 month(s)  Provider:   Aleene Passe, MD     Other Instructions You have been referred to our Afib clinic. They will reach out to you to make an appointment.  ZIO XT- Long Term Monitor Instructions  Your physician has requested you wear a ZIO patch monitor for 14 days.  This is a single patch monitor. [employer] supplies one patch monitor per enrollment. Additional stickers are not available. Please do not apply patch if you will be having a Nuclear Stress Test,  Echocardiogram, Cardiac CT, MRI, or Chest Xray during the period you would be wearing the  monitor. The patch cannot be worn during these tests. You cannot remove and re-apply the  ZIO XT patch monitor.  Your ZIO patch monitor will be mailed 3 day  USPS to your address on file. It may take 3-5 days  to receive your monitor after you have been enrolled.  Once you have received your monitor, please review the enclosed instructions. Your monitor  has already been registered assigning a specific monitor serial # to you.  Billing and Patient Assistance Program  Information  We have supplied [employer] with any of your insurance information on file for billing purposes. [employer] offers a sliding scale Patient Assistance Program for patients that do not have  insurance, or whose insurance does not completely cover the cost of the ZIO monitor.  You must apply for the Patient Assistance Program to qualify for this discounted rate.  To apply, please call [employer] at [phone], select option 4, select option 2, ask to apply for  Patient Assistance Program. Meredeth will ask your household income, and how many people  are in your household. They will quote your out-of-pocket cost based on that information.  [employer] will also be able to set up a 33-month, interest-free payment plan if needed.  Applying the monitor   Shave hair from upper left chest.  Hold abrader disc by orange tab. Rub abrader in 40 strokes over the upper left chest as  indicated in your monitor instructions.  Clean area with 4 enclosed alcohol  pads. Let dry.  Apply patch as indicated in monitor instructions. Patch will be placed under collarbone on left  side of chest with arrow pointing upward.  Rub patch adhesive wings for 2 minutes. Remove white label marked 1. Remove the white  label marked 2. Rub patch adhesive wings for 2 additional minutes.  While looking in a mirror, press and release button in center of patch. A small green light will  flash 3-4 times. This will be your only indicator that the monitor has been turned on.  Do not shower for the first 24 hours. You may shower after the first 24 hours.  Press the button if you feel a symptom. You will hear a small click. Record Date, Time and  Symptom in the Patient Logbook.  When you are ready to remove the patch, follow instructions on the last 2 pages of Patient  Logbook. Stick patch monitor onto the last page of Patient Logbook.  Place Patient Logbook in the blue and white box. Use locking tab on box and tape box closed   securely. The blue and white box has prepaid postage on it. Please place it in the mailbox as  soon as possible. Your physician should have your test results approximately 7 days after the  monitor has been mailed back to [facility].  Call [facility] Customer Care at [phone] if you have questions regarding  your ZIO XT patch monitor. Call them immediately if you see an orange light blinking on your  monitor.  If your monitor falls off in less than 4 days, contact our Monitor department at [phone].  If your monitor becomes loose or falls off after 4 days call [employer] at [phone] for  suggestions on securing your monitor        1st Floor: - Lobby - Registration  - Pharmacy  - Lab - Cafe  2nd Floor: - PV Lab - Diagnostic Testing (echo, CT, nuclear med)  3rd Floor: - Vacant  4th Floor: - TCTS (cardiothoracic surgery) - AFib Clinic - Structural Heart Clinic - Vascular Surgery  - Vascular Ultrasound  5th Floor: - HeartCare Cardiology (general and EP) - Clinical Pharmacy for coumadin, hypertension, lipid, weight-loss medications, and  med management appointments    Valet parking services will be available as well.

## 2023-10-28 NOTE — Progress Notes (Unsigned)
 Enrolled patient for a 14 day Zio XT  monitor to be mailed to patients home

## 2023-10-29 ENCOUNTER — Encounter (HOSPITAL_COMMUNITY): Payer: Self-pay

## 2023-10-31 ENCOUNTER — Other Ambulatory Visit: Payer: Self-pay | Admitting: Surgery

## 2023-10-31 ENCOUNTER — Ambulatory Visit (HOSPITAL_COMMUNITY): Payer: Medicare Other | Attending: Cardiovascular Disease

## 2023-10-31 ENCOUNTER — Encounter: Payer: Self-pay | Admitting: Cardiovascular Disease

## 2023-10-31 ENCOUNTER — Other Ambulatory Visit: Payer: Self-pay

## 2023-10-31 DIAGNOSIS — I7121 Aneurysm of the ascending aorta, without rupture: Secondary | ICD-10-CM

## 2023-10-31 DIAGNOSIS — I4891 Unspecified atrial fibrillation: Secondary | ICD-10-CM | POA: Insufficient documentation

## 2023-10-31 DIAGNOSIS — R002 Palpitations: Secondary | ICD-10-CM | POA: Insufficient documentation

## 2023-10-31 LAB — ECHOCARDIOGRAM COMPLETE
Area-P 1/2: 3.36 cm2
S' Lateral: 2.6 cm

## 2023-10-31 NOTE — Telephone Encounter (Signed)
 Pt is requesting a refill on tadalafil . Dr. Alroy Aspen did not prescribe this medication. Would Dr. Alroy Aspen like to refill this medication? Please address

## 2023-11-03 MED ORDER — TADALAFIL 10 MG PO TABS: 10.0000 mg | ORAL_TABLET | Freq: Every day | ORAL | 1 refills | Status: AC | PRN

## 2023-11-03 NOTE — Telephone Encounter (Signed)
 Refill for Cialis  sent to pharmacy at this time.

## 2023-11-11 ENCOUNTER — Encounter: Payer: Self-pay | Admitting: Surgery

## 2023-11-12 DIAGNOSIS — I4891 Unspecified atrial fibrillation: Secondary | ICD-10-CM

## 2023-11-12 DIAGNOSIS — R002 Palpitations: Secondary | ICD-10-CM

## 2023-11-14 ENCOUNTER — Telehealth: Payer: Self-pay | Admitting: Internal Medicine

## 2023-11-14 NOTE — Telephone Encounter (Signed)
-----   Message from Nurse Vena Austria sent at 10/28/2023  5:49 PM EST ----- Regarding: ECHO and 3 month f/u This patient needs to be scheduled for an ECHO and a 3 month f/u appointment with Kristeen Miss, MD. Thank you!

## 2023-11-14 NOTE — Telephone Encounter (Signed)
 Patient contacted 3x with no success, will send mychart message

## 2023-11-17 DIAGNOSIS — Z125 Encounter for screening for malignant neoplasm of prostate: Secondary | ICD-10-CM | POA: Diagnosis not present

## 2023-11-17 DIAGNOSIS — E785 Hyperlipidemia, unspecified: Secondary | ICD-10-CM | POA: Diagnosis not present

## 2023-11-17 DIAGNOSIS — Z1389 Encounter for screening for other disorder: Secondary | ICD-10-CM | POA: Diagnosis not present

## 2023-11-25 DIAGNOSIS — Z1389 Encounter for screening for other disorder: Secondary | ICD-10-CM | POA: Diagnosis not present

## 2023-11-25 DIAGNOSIS — Z0000 Encounter for general adult medical examination without abnormal findings: Secondary | ICD-10-CM | POA: Diagnosis not present

## 2023-11-25 DIAGNOSIS — Z1331 Encounter for screening for depression: Secondary | ICD-10-CM | POA: Diagnosis not present

## 2023-11-25 DIAGNOSIS — I4891 Unspecified atrial fibrillation: Secondary | ICD-10-CM | POA: Diagnosis not present

## 2023-11-26 ENCOUNTER — Encounter (HOSPITAL_COMMUNITY): Payer: Self-pay | Admitting: Internal Medicine

## 2023-11-26 ENCOUNTER — Ambulatory Visit (HOSPITAL_COMMUNITY)
Admission: RE | Admit: 2023-11-26 | Discharge: 2023-11-26 | Disposition: A | Discharge status: Home / Self Care | Payer: Medicare Other | Source: Ambulatory Visit | Attending: Internal Medicine | Admitting: Internal Medicine

## 2023-11-26 VITALS — BP 136/80 | HR 61 | Ht 73.0 in | Wt 196.0 lb

## 2023-11-26 DIAGNOSIS — Z8673 Personal history of transient ischemic attack (TIA), and cerebral infarction without residual deficits: Secondary | ICD-10-CM | POA: Diagnosis not present

## 2023-11-26 DIAGNOSIS — E785 Hyperlipidemia, unspecified: Secondary | ICD-10-CM | POA: Insufficient documentation

## 2023-11-26 DIAGNOSIS — I480 Paroxysmal atrial fibrillation: Secondary | ICD-10-CM | POA: Insufficient documentation

## 2023-11-26 DIAGNOSIS — Z7901 Long term (current) use of anticoagulants: Secondary | ICD-10-CM | POA: Insufficient documentation

## 2023-11-26 DIAGNOSIS — D6869 Other thrombophilia: Secondary | ICD-10-CM | POA: Insufficient documentation

## 2023-11-26 NOTE — Progress Notes (Signed)
 Primary Care Physician: Charlane Ferretti, DO Primary Cardiologist: Kristeen Miss, MD Electrophysiologist: None     Referring Physician: Dr. Arloa Koh is a 73 y.o. male with a history of TIA, HLD, mild aortic root dilation, and paroxysmal atrial fibrillation who presents for consultation in the [facility]. Seen by Dr. Elease Hashimoto on 10/28/23 found to be in Afib; patient asymptomatic so cardiac monitor placed to determine paroxysmal or persistent. Patient is on Eliquis for a CHADS2VASC score of 3.  On evaluation today, he is currently in NSR. He is asymptomatic but does note during recent quail hunt last week he was tired during strenuous trek back to the buggy. He may have been in Afib at that time. He placed the monitor recently due to going on a fishing trip to [place] after Dr. Harvie Bridge office visit. He is compliant with Eliquis.   Today, he denies symptoms of palpitations, chest pain, shortness of breath, orthopnea, PND, lower extremity edema, dizziness, presyncope, syncope, snoring, daytime somnolence, bleeding, or neurologic sequela. The patient is tolerating medications without difficulties and is otherwise without complaint today.    he has a BMI of Body mass index is 25.86 kg/m.Marland Kitchen Filed Weights   11/26/23 0933  Weight: 88.9 kg    Current Outpatient Medications  Medication Sig Dispense Refill   AMBIEN 5 MG tablet      apixaban (ELIQUIS) 5 MG TABS tablet Take 1 tablet (5 mg total) by mouth 2 (two) times daily. 180 tablet 3   apixaban (ELIQUIS) 5 MG TABS tablet Take 1 tablet (5 mg total) by mouth 2 (two) times daily. 56 tablet 0   B Complex-C (VITAMIN B + C COMPLEX PO) Take 1 tablet by mouth daily.     Cyanocobalamin (VITAMIN B12) 1000 MCG TBCR 1 tablet Orally Once a day for 30 day(s)     Multiple Vitamins-Minerals (ONE-A-DAY MENS 50+ ADVANTAGE) TABS take one tablet by mouth everyday Oral     rosuvastatin (CRESTOR) 10 MG tablet TAKE ONE  TABLET BY MOUTH DAILY 90 tablet 2   tadalafil (CIALIS) 10 MG tablet Take 1 tablet (10 mg total) by mouth daily as needed. For erectile dysfunction 30-45 minutes prior to sexual activity 10 tablet 1   tamsulosin (FLOMAX) 0.4 MG CAPS capsule 1 capsule, cautioned with dizziness with standing Orally Once a day for 30 day(s)     valACYclovir (VALTREX) 500 MG tablet Take 500 mg by mouth daily as needed.     No current facility-administered medications for this encounter.    Atrial Fibrillation Management history:  Previous antiarrhythmic drugs: none Previous cardioversions: none Previous ablations: none Anticoagulation history: Eliquis   ROS- All systems are reviewed and negative except as per the HPI above.  Physical Exam: BP 136/80   Pulse 61   Ht 6\' 1"  (1.854 m)   Wt 88.9 kg   BMI 25.86 kg/m   GEN: Well nourished, well developed in no acute distress NECK: No JVD; No carotid bruits CARDIAC: Regular rate and rhythm, no murmurs, rubs, gallops RESPIRATORY:  Clear to auscultation without rales, wheezing or rhonchi  ABDOMEN: Soft, non-tender, non-distended EXTREMITIES:  No edema; No deformity   EKG today demonstrates  Vent. rate 61 BPM PR interval 218 ms QRS duration 94 ms QT/QTcB 394/396 ms P-R-T axes 73 32 29 Sinus rhythm with 1st degree A-V block with frequent Premature ventricular complexes Otherwise normal ECG When compared with ECG of 28-Oct-2023 16:57, PREVIOUS ECG IS PRESENT  Echo 10/31/23 demonstrated  1. Left ventricular ejection fraction, by estimation, is 65 to 70%. The  left ventricle has normal function. The left ventricle has no regional  wall motion abnormalities. Left ventricular diastolic parameters are  consistent with Grade II diastolic  dysfunction (pseudonormalization). The average left ventricular global  longitudinal strain is -21.0 %. The global longitudinal strain is normal.   2. Right ventricular systolic function is normal. The right ventricular   size is normal.   3. Left atrial size was moderately dilated.   4. Right atrial size was mildly dilated.   5. The mitral valve is normal in structure. Trivial mitral valve  regurgitation. No evidence of mitral stenosis.   6. The aortic valve is normal in structure. Aortic valve regurgitation is  not visualized. Aortic valve sclerosis/calcification is present, without  any evidence of aortic stenosis.   7. Aortic dilatation noted. There is moderate dilatation of the aortic  root, measuring 44 mm. There is borderline dilatation of the ascending  aorta, measuring 38 mm.   8. The inferior vena cava is normal in size with greater than 50%  respiratory variability, suggesting right atrial pressure of 3 mmHg.   ASSESSMENT & PLAN CHA2DS2-VASc Score = 3  The patient's score is based upon: CHF History: 0 HTN History: 0 Diabetes History: 0 Stroke History: 2 Vascular Disease History: 0 Age Score: 1 Gender Score: 0       ASSESSMENT AND PLAN: Paroxysmal Atrial Fibrillation (ICD10:  I48.0) The patient's CHA2DS2-VASc score is 3, indicating a 3.2% annual risk of stroke.    He is currently in NSR. Education provided about Afib. Discussion about possible triggers including excess caffeine, alcohol, and untreated sleep apnea. After discussion, we will proceed with conservative observation at this time. He will mail back monitor so Dr. Elease Hashimoto can interpret data.   Secondary Hypercoagulable State (ICD10:  D68.69) The patient is at significant risk for stroke/thromboembolism based upon his CHA2DS2-VASc Score of 3.  Continue Apixaban (Eliquis).  Continue without interruption. We discussed his risk score, reasoning behind anticoagulation, and how at this time recommendation is for patient to continue indefinitely.    Follow up Afib clinic prn.   Lake Bells, PA-C  Afib Clinic [facility] [address] [place], [place] [address] [phone]

## 2023-12-03 DIAGNOSIS — I4891 Unspecified atrial fibrillation: Secondary | ICD-10-CM | POA: Diagnosis not present

## 2023-12-03 DIAGNOSIS — R002 Palpitations: Secondary | ICD-10-CM | POA: Diagnosis not present

## 2023-12-05 ENCOUNTER — Ambulatory Visit
Admission: RE | Admit: 2023-12-05 | Discharge: 2023-12-05 | Disposition: A | Discharge status: Home / Self Care | Payer: Medicare Other | Source: Ambulatory Visit | Attending: Surgery | Admitting: Surgery

## 2023-12-05 DIAGNOSIS — I7121 Aneurysm of the ascending aorta, without rupture: Secondary | ICD-10-CM

## 2023-12-05 MED ORDER — IOPAMIDOL (ISOVUE-370) INJECTION 76%
100.0000 mL | Freq: Once | INTRAVENOUS | Status: AC | PRN
  Administered 2023-12-05: 80 mL via INTRAVENOUS

## 2023-12-07 ENCOUNTER — Encounter: Payer: Self-pay | Admitting: Cardiovascular Disease

## 2023-12-08 ENCOUNTER — Other Ambulatory Visit: Payer: Medicare Other

## 2023-12-08 NOTE — Progress Notes (Unsigned)
 HPI: [name] is a 73 year old gentleman with a past history of paroxysmal atrial fibrillation, TIAs, dyslipidemia and secondary hypercoagulable state.  He was referred to [place] couple of years ago for evaluation and surveillance of dilated thoracic aorta seen on a calcium scoring coronary CT scan done in 2023.  He returns today for scheduled annual follow-up after CTA and recent echocardiogram. [name] reports no changes in his health since his last visit.  He is scheduled to see Dr. Lalla Brothers in May for evaluation of his atrial fibrillation and potential ablation.  [name] has not had any chest pain.  Current Outpatient Medications  Medication Sig Dispense Refill   AMBIEN 5 MG tablet      apixaban (ELIQUIS) 5 MG TABS tablet Take 1 tablet (5 mg total) by mouth 2 (two) times daily. 180 tablet 3   apixaban (ELIQUIS) 5 MG TABS tablet Take 1 tablet (5 mg total) by mouth 2 (two) times daily. 56 tablet 0   B Complex-C (VITAMIN B + C COMPLEX PO) Take 1 tablet by mouth daily.     Cyanocobalamin (VITAMIN B12) 1000 MCG TBCR 1 tablet Orally Once a day for 30 day(s)     Multiple Vitamins-Minerals (ONE-A-DAY MENS 50+ ADVANTAGE) TABS take one tablet by mouth everyday Oral     rosuvastatin (CRESTOR) 10 MG tablet TAKE ONE TABLET BY MOUTH DAILY 90 tablet 2   tadalafil (CIALIS) 10 MG tablet Take 1 tablet (10 mg total) by mouth daily as needed. For erectile dysfunction 30-45 minutes prior to sexual activity 10 tablet 1   tamsulosin (FLOMAX) 0.4 MG CAPS capsule 1 capsule, cautioned with dizziness with standing Orally Once a day for 30 day(s)     valACYclovir (VALTREX) 500 MG tablet Take 500 mg by mouth daily as needed.     No current facility-administered medications for this visit.    Physical Exam:  Vital signs: BP 132/86 Heart rate 62 Respirations 18 O2 saturation 100% on room air  General: Very pleasant 73 year old gentleman in no distress. Heart: Regular rate and rhythm, no murmur Chest:  Breath sounds are clear to auscultation. Extremities: Well-perfused, no peripheral edema   Diagnostic Tests:  CLINICAL DATA:  CLINICAL DATA Follow-up thoracic aneurysm   EXAM: CT ANGIOGRAPHY CHEST WITH CONTRAST   TECHNIQUE: Multidetector CT imaging of the chest was performed using the standard protocol during bolus administration of intravenous contrast. Multiplanar CT image reconstructions and MIPs were obtained to evaluate the vascular anatomy.   RADIATION DOSE REDUCTION: This exam was performed according to the departmental dose-optimization program which includes automated exposure control, adjustment of the mA and/or kV according to patient size and/or use of iterative reconstruction technique.   CONTRAST:  80mL ISOVUE-370 IOPAMIDOL (ISOVUE-370) INJECTION 76%   COMPARISON:  April 30, 2023   FINDINGS: Cardiovascular: Satisfactory opacification of the pulmonary arteries to the segmental level. No evidence of pulmonary embolism. Normal heart size. No pericardial effusion. Ascending aorta measures 3.6 x 3.7 cm similar to prior examination. The arch is normal. The descending thoracic aorta is normal. Measured 3.1 x 3.1 cm if the size force of the abdominal aorta are unremarkable   No significant coronary artery calcifications be   Mediastinum/Nodes: No enlarged mediastinal, hilar, or axillary lymph nodes. Thyroid gland, trachea, and esophagus demonstrate no significant findings.   Lungs/Pleura: Lungs are clear. No pleural effusion or pneumothorax.   Upper Abdomen: Gallstones without gallbladder inflammatory changes correlate with chronic cholelithiasis   Musculoskeletal: No chest wall abnormality. No acute or  significant osseous findings.   Review of the MIP images confirms the above findings.   IMPRESSION: *No evidence of pulmonary embolism. Ascending aorta is normal in size with prior examination. To the aortic root measures 4.1 cm over with prior examination.  I recommend annual imaging follow-up by CTA or MRA * *No acute cardiopulmonary process. *Chronic cholelithiasis.     Electronically Signed   By: Shaaron Adler M.D.   On: 12/08/2023 10:43    ECHOCARDIOGRAM REPORT       Patient Name:   Mike Arias Date of Exam: 10/31/2023  Medical Rec #:  [id number]      Height:       73.0 in  Accession #:    [id number]     Weight:       196.0 lb  Date of Birth:  [birth year]       BSA:          2.133 m  Patient Age:    72 years       BP:           111/76 mmHg  Patient Gender: M              HR:           66 bpm.  Exam Location:  [address]   Procedure: 2D Echo, Cardiac Doppler, Color Doppler, 3D Echo and Strain  Analysis   Indications:   I48.91* Unspecified atrial fibrillation    History:        Patient has prior history of Echocardiogram examinations,  most                 recent 04/15/2023. TIA, Signs/Symptoms:Shortness of Breath  and                 Fatigue; Risk Factors:Dyslipidemia. Palpitations.  Bradycardia.                 Remote history of smoking.    Sonographer:    Cathie Beams RCS  Referring Phys: [phone] Mike Arias   IMPRESSIONS     1. Left ventricular ejection fraction, by estimation, is 65 to 70%. The  left ventricle has normal function. The left ventricle has no regional  wall motion abnormalities. Left ventricular diastolic parameters are  consistent with Grade II diastolic  dysfunction (pseudonormalization). The average left ventricular global  longitudinal strain is -21.0 %. The global longitudinal strain is normal.   2. Right ventricular systolic function is normal. The right ventricular  size is normal.   3. Left atrial size was moderately dilated.   4. Right atrial size was mildly dilated.   5. The mitral valve is normal in structure. Trivial mitral valve  regurgitation. No evidence of mitral stenosis.   6. The aortic valve is normal in structure. Aortic valve regurgitation is  not visualized. Aortic  valve sclerosis/calcification is present, without  any evidence of aortic stenosis.   7. Aortic dilatation noted. There is moderate dilatation of the aortic  root, measuring 44 mm. There is borderline dilatation of the ascending  aorta, measuring 38 mm.   8. The inferior vena cava is normal in size with greater than 50%  respiratory variability, suggesting right atrial pressure of 3 mmHg.   FINDINGS   Left Ventricle: Left ventricular ejection fraction, by estimation, is 65  to 70%. The left ventricle has normal function. The left ventricle has no  regional wall motion abnormalities. The average left ventricular global  longitudinal strain  is -21.0 %.  The global longitudinal strain is normal. The left ventricular internal  cavity size was normal in size. There is no left ventricular hypertrophy.  Left ventricular diastolic parameters are consistent with Grade II  diastolic dysfunction  (pseudonormalization).   Right Ventricle: The right ventricular size is normal. No increase in  right ventricular wall thickness. Right ventricular systolic function is  normal.   Left Atrium: Left atrial size was moderately dilated.   Right Atrium: Right atrial size was mildly dilated.   Pericardium: There is no evidence of pericardial effusion.   Mitral Valve: The mitral valve is normal in structure. Trivial mitral  valve regurgitation. No evidence of mitral valve stenosis.   Tricuspid Valve: The tricuspid valve is normal in structure. Tricuspid  valve regurgitation is trivial. No evidence of tricuspid stenosis.   Aortic Valve: The aortic valve is normal in structure. Aortic valve  regurgitation is not visualized. Aortic valve sclerosis/calcification is  present, without any evidence of aortic stenosis.   Pulmonic Valve: The pulmonic valve was normal in structure. Pulmonic valve  regurgitation is trivial. No evidence of pulmonic stenosis.   Aorta: Aortic dilatation noted. There is moderate  dilatation of the aortic  root, measuring 44 mm. There is borderline dilatation of the ascending  aorta, measuring 38 mm.   Venous: The inferior vena cava is normal in size with greater than 50%  respiratory variability, suggesting right atrial pressure of 3 mmHg.   IAS/Shunts: No atrial level shunt detected by color flow Doppler.     LEFT VENTRICLE  PLAX 2D  LVIDd:         4.40 cm   Diastology  LVIDs:         2.60 cm   LV e' medial:    8.05 cm/s  LV PW:         1.10 cm   LV E/e' medial:  13.8  LV IVS:        1.00 cm   LV e' lateral:   12.50 cm/s  LVOT diam:     2.30 cm   LV E/e' lateral: 8.9  LV SV:         133  LV SV Index:   62        2D Longitudinal Strain  LVOT Area:     4.15 cm  2D Strain GLS Avg:     -21.0 %                             3D Volume EF:                           3D EF:        71 %                           LV EDV:       155 ml                           LV ESV:       45 ml                           LV SV:        110 ml   RIGHT VENTRICLE  RV Basal diam:  3.50 cm  RV S prime:     16.00 cm/s  TAPSE (M-mode): 2.2 cm   LEFT ATRIUM             Index        RIGHT ATRIUM           Index  LA diam:        4.40 cm 2.06 cm/m   RA Area:     27.60 cm  LA Vol (A2C):   97.8 ml 45.85 ml/m  RA Volume:   91.30 ml  42.80 ml/m  LA Vol (A4C):   67.9 ml 31.83 ml/m  LA Biplane Vol: 82.7 ml 38.77 ml/m   AORTIC VALVE  LVOT Vmax:   160.00 cm/s  LVOT Vmean:  105.000 cm/s  LVOT VTI:    0.320 m    AORTA  Ao Root diam: 4.40 cm  Ao Asc diam:  3.80 cm   MITRAL VALVE  MV Area (PHT): 3.36 cm     SHUNTS  MV Decel Time: 226 msec     Systemic VTI:  0.32 m  MV E velocity: 111.00 cm/s  Systemic Diam: 2.30 cm  MV A velocity: 85.40 cm/s  MV E/A ratio:  1.30   Arvilla Meres MD  Electronically signed by Arvilla Meres MD  Signature Date/Time: 10/31/2023/3:30:10 PM         Impression / Plan: We discussed the findings of the most recent CTA and echocardiogram showing no  thoracic aortic aneurysm and normal aortic valve structure and function.  I explained that these findings do not require ongoing surveillance but we would certainly continue to follow with annual CTs if that was his preference.  He decided to forego annual screenings.  Follow-up as desired.  Leary Roca, PA-C [facility] [phone]

## 2023-12-09 ENCOUNTER — Telehealth: Payer: Self-pay

## 2023-12-09 DIAGNOSIS — I480 Paroxysmal atrial fibrillation: Secondary | ICD-10-CM

## 2023-12-09 NOTE — Telephone Encounter (Signed)
 Called and spoke with patient on need to see EP for A-fib management based on monitor findings. Pt agreeable. Referral placed to [facility] at this time.

## 2023-12-09 NOTE — Telephone Encounter (Signed)
-----   Message from Kristeen Miss sent at 12/08/2023  5:48 PM EDT ----- He is asymptomatic with his atrial fib  In the past we have treated asymptomatic paroxysmal atrial fibrillation with anticoagulation and observation however there are some studies that show that rhythm control may offer a benefit. I would like to refer him to Dr. Lalla Brothers to see if atrial fibrillation ablation might benefit him.  Please make an on appointment with Dr. Lalla Brothers to discuss whether or not this would be the right procedure for him.  Or alternatively, Dr. Lalla Brothers may wish to start him on an antiarrhythmic medication and follow him.   PN ----- Message ----- From: Lars Mage, RN Sent: 12/08/2023   5:10 PM EDT To: Vesta Mixer, MD  Anitarryhthmic medication? ----- Message ----- From: Vesta Mixer, MD Sent: 12/07/2023  11:09 AM EDT To: Lars Mage, RN   Predominant rhythm is sinus rhythm.   Episodes of paroxysmal atrial fib ( 5% Afib burden) .  his longest episode lasted 9 hours and 51 minutes   occasional  PACs with a PAC burden of 4.2%.   Occasional pVCs with PVC burden of 2.6%.   27 runs of Supraventricular tachycardia.   The fastest interval lasted 7 beats with a max HR of 141.   the longest episode lasted 13 beats with an avg. HR of 112  He has occasional episodes of sinus bradycardia . Continue current meds, current plans

## 2023-12-10 ENCOUNTER — Ambulatory Visit (INDEPENDENT_AMBULATORY_CARE_PROVIDER_SITE_OTHER): Payer: Medicare Other | Admitting: Physician Assistant

## 2023-12-10 VITALS — BP 132/86 | HR 62 | Resp 18 | Ht 73.0 in | Wt 193.0 lb

## 2023-12-10 DIAGNOSIS — I7121 Aneurysm of the ascending aorta, without rupture: Secondary | ICD-10-CM | POA: Diagnosis not present

## 2023-12-10 NOTE — Patient Instructions (Signed)
 Most recent chest CTA and echocardiogram do not show an aneurysm of the ascending aorta.  The aortic valve is normal in structure and function. Current guidelines do not require ongoing surveillance of the mild ascending aortic dilation.

## 2023-12-11 DIAGNOSIS — M170 Bilateral primary osteoarthritis of knee: Secondary | ICD-10-CM | POA: Diagnosis not present

## 2023-12-31 DIAGNOSIS — K080 Exfoliation of teeth due to systemic causes: Secondary | ICD-10-CM | POA: Diagnosis not present

## 2024-01-12 DIAGNOSIS — D225 Melanocytic nevi of trunk: Secondary | ICD-10-CM | POA: Diagnosis not present

## 2024-01-12 DIAGNOSIS — L821 Other seborrheic keratosis: Secondary | ICD-10-CM | POA: Diagnosis not present

## 2024-01-12 DIAGNOSIS — S80862A Insect bite (nonvenomous), left lower leg, initial encounter: Secondary | ICD-10-CM | POA: Diagnosis not present

## 2024-01-12 DIAGNOSIS — L814 Other melanin hyperpigmentation: Secondary | ICD-10-CM | POA: Diagnosis not present

## 2024-01-12 DIAGNOSIS — L570 Actinic keratosis: Secondary | ICD-10-CM | POA: Diagnosis not present

## 2024-01-12 DIAGNOSIS — R233 Spontaneous ecchymoses: Secondary | ICD-10-CM | POA: Diagnosis not present

## 2024-01-26 ENCOUNTER — Ambulatory Visit: Attending: Cardiology | Admitting: Cardiology

## 2024-01-26 ENCOUNTER — Other Ambulatory Visit: Payer: Self-pay

## 2024-01-26 ENCOUNTER — Encounter: Payer: Self-pay | Admitting: Cardiology

## 2024-01-26 VITALS — BP 132/84 | HR 63 | Ht 73.0 in | Wt 194.0 lb

## 2024-01-26 DIAGNOSIS — I480 Paroxysmal atrial fibrillation: Secondary | ICD-10-CM

## 2024-01-26 DIAGNOSIS — G459 Transient cerebral ischemic attack, unspecified: Secondary | ICD-10-CM

## 2024-01-26 NOTE — Progress Notes (Signed)
 Electrophysiology Office Note:    Date:  01/26/2024   ID:  OSINACHI ZUCCARELLO, DOB 02/16/1951, MRN [id number]  [facility] Cardiologist:  Ahmad Alert, MD  [facility] Electrophysiologist:  Boyce Byes, MD   Referring MD: Alroy Aspen Lela Purple, MD   Chief Complaint: Atrial fibrillation  History of Present Illness:    Mr. Dean Every is a 73 year old man who I am seeing today for an evaluation of atrial fibrillation at the request of Waynette Hait.  The patient has a history of TIA, hyperlipidemia, paroxysmal atrial fibrillation.  He was previously followed by Dr. Alroy Aspen.  He has a CHA2DS2-VASc of 3 and takes Eliquis  for stroke prophylaxis. He last saw Waynette Hait in clinic November 26, 2023.  At that appointment he was doing well and relatively asymptomatic with his atrial fibrillation.  There was correspondence between him and Dr. Alroy Aspen discussing rhythm control for atrial fibrillation and the patient presents today to discuss catheter ablation for his arrhythmia.  He is very active man.  He tells me that when he [place] hunts or if he is playing golf he will feel like his heart is going beyond his limits.  He does not appreciate the actual palpitations.  No syncope or presyncope.  No chest pain.  He just reports intermittent shortness of breath and decreased exercise tolerance.  He swims regularly.  He is on Eliquis  for stroke prophylaxis.       Their past medical, social and family history was reviewed.   ROS:   Please see the history of present illness.    All other systems reviewed and are negative.  EKGs/Labs/Other Studies Reviewed:    The following studies were reviewed today:  October 31, 2023 echo EF 65-70 RV normal Moderately dilated left atrium Trivial MR  December 07, 2023 ZIO monitor personally reviewed 5% burden of atrial fibrillation, occasional PVCs, occasional PACs   November 26, 2023 EKG shows sinus rhythm, frequent monomorphic PVCs.  QTc 396 ms.      Physical Exam:     VS:  BP 132/84   Pulse 63   Ht 6\' 1"  (1.854 m)   Wt 194 lb (88 kg)   SpO2 94%   BMI 25.60 kg/m     Wt Readings from Last 3 Encounters:  01/26/24 194 lb (88 kg)  12/10/23 193 lb (87.5 kg)  11/26/23 196 lb (88.9 kg)     GEN: no distress CARD: RRR, No MRG RESP: No IWOB. CTAB.        ASSESSMENT AND PLAN:    1. Paroxysmal atrial fibrillation (HCC)   2. TIA (transient ischemic attack)      #Paroxysmal atrial fibrillation Overall low burden, 5% on most recent ZIO monitor On Eliquis  for stroke prophylaxis given a CHA2DS2-VASc of 3 We discussed his atrial fibrillation in detail during today's office visit.  We discussed rhythm control versus rate control.  We discussed the pros and cons of each approach.  We discussed data supporting rhythm control in patients with atrial fibrillation.  Discussed treatment options today for AF including antiarrhythmic drug therapy and ablation. Discussed risks, recovery and likelihood of success with each treatment strategy. Risk, benefits, and alternatives to EP study and ablation for afib were discussed. These risks include but are not limited to stroke, bleeding, vascular damage, tamponade, perforation, damage to the esophagus, lungs, phrenic nerve and other structures, pulmonary vein stenosis, worsening renal function, coronary vasospasm and death.  Discussed potential need for repeat ablation procedures and antiarrhythmic drugs after an  initial ablation. The patient understands these risk and wishes to proceed.  We will therefore proceed with catheter ablation at the next available time.  Carto, ICE, anesthesia are requested for the procedure.  Will also obtain CT PV protocol prior to the procedure to exclude LAA thrombus and further evaluate atrial anatomy.  #History of TIA Continue Eliquis  for stroke prophylaxis   Signed, Donelda Fujita T. Marven Slimmer, MD, [facility], [facility] 01/26/2024 2:20 PM    Electrophysiology [facility]

## 2024-01-26 NOTE — Patient Instructions (Addendum)
 Medication Instructions:  Your physician recommends that you continue on your current medications as directed. Please refer to the Current Medication list given to you today.  *If you need a refill on your cardiac medications before your next appointment, please call your pharmacy*  Lab Work: BMET and CBC - you may go to any [facility] location to have these drawn within 30 days of your procedure  Testing/Procedures: Cardiac CT Your physician has requested that you have cardiac CT. Cardiac computed tomography (CT) is a painless test that uses an x-ray machine to take clear, detailed pictures of your heart. For further information please visit [email or web address] Please follow instruction sheet as given. We will call you to schedule your CT scan. It will be done about three weeks prior to your ablation.  Ablation Your physician has recommended that you have an ablation. Catheter ablation is a medical procedure used to treat some cardiac arrhythmias (irregular heartbeats). During catheter ablation, a long, thin, flexible tube is put into a blood vessel in your groin (upper thigh), or neck. This tube is called an ablation catheter. It is then guided to your heart through the blood vessel. Radio frequency waves destroy small areas of heart tissue where abnormal heartbeats may cause an arrhythmia to start.  You are scheduled for Atrial Fibrillation Ablation on Thursday, July 18 with Dr. Harvie Liner.Please arrive at the Main Entrance A at [facility]: [address] [place], [place] [address] at 10:30 AM    Follow-Up: At [facility], you and your health needs are our priority.  As part of our continuing mission to provide you with exceptional heart care, we have created designated Provider Care Teams.  These Care Teams include your primary Cardiologist (physician) and Advanced Practice Providers (APPs -  Physician Assistants and Nurse Practitioners) who all work together to provide  you with the care you need, when you need it.   Your next appointment:   We will contact you about your post-procedure follow up appointments.

## 2024-01-27 ENCOUNTER — Telehealth: Payer: Self-pay | Admitting: Cardiology

## 2024-01-27 NOTE — Telephone Encounter (Signed)
 Unfortunately the spot on 6/17 that was offered to Mike Arias yesterday has been filled. There are no current openings prior to his scheduled date of 7/18. He has been added to Dr. Candace Cerise wait list and if something becomes available prior to 7/18 we will notify him.

## 2024-01-27 NOTE — Telephone Encounter (Signed)
 Spoke with pt and advised of information below per [facility].  Pt verbalizes understanding and thanked Charity fundraiser for the call.

## 2024-01-27 NOTE — Telephone Encounter (Signed)
 Spoke with pt and advised per EP scheduler there is not an earlier appointment available at this time and pt is currently on the waitlist.  Pt states his fatigue is unbearable at this point.  Pt advised will forward to Dr Candace Cerise nurse to further assist with earlier availability if possible.  Pt states he was offered June 17th and is now willing to cancel his scheduled trip if this date is still available.

## 2024-01-27 NOTE — Telephone Encounter (Signed)
 New Message:     Patient say he would like to have his Ablation moved up asap please. He says he has no energy, just feels terrible from this.

## 2024-02-04 ENCOUNTER — Encounter: Payer: Self-pay | Admitting: Cardiology

## 2024-02-04 NOTE — Telephone Encounter (Signed)
 Left message for pt to call.

## 2024-02-10 NOTE — Telephone Encounter (Unsigned)
 Left message for patient to call back to discuss symptoms.

## 2024-02-12 ENCOUNTER — Ambulatory Visit: Attending: Physician Assistant | Admitting: Physician Assistant

## 2024-02-12 ENCOUNTER — Other Ambulatory Visit (HOSPITAL_COMMUNITY): Payer: Self-pay

## 2024-02-12 ENCOUNTER — Encounter: Payer: Self-pay | Admitting: Physician Assistant

## 2024-02-12 VITALS — BP 124/82 | HR 66 | Ht 73.0 in | Wt 195.0 lb

## 2024-02-12 DIAGNOSIS — D6869 Other thrombophilia: Secondary | ICD-10-CM | POA: Diagnosis not present

## 2024-02-12 DIAGNOSIS — Z01818 Encounter for other preprocedural examination: Secondary | ICD-10-CM

## 2024-02-12 DIAGNOSIS — I480 Paroxysmal atrial fibrillation: Secondary | ICD-10-CM

## 2024-02-12 DIAGNOSIS — Z79899 Other long term (current) drug therapy: Secondary | ICD-10-CM | POA: Diagnosis not present

## 2024-02-12 MED ORDER — AMIODARONE HCL 200 MG PO TABS: 200.0000 mg | ORAL_TABLET | Freq: Every day | ORAL | 3 refills | Status: DC

## 2024-02-12 MED ORDER — AMIODARONE HCL 200 MG PO TABS
200.0000 mg | ORAL_TABLET | Freq: Every day | ORAL | 3 refills | Status: DC
  Filled 2024-02-12 (×2): qty 90, 90d supply, fill #0

## 2024-02-12 NOTE — Progress Notes (Addendum)
 Cardiology Office Note:  .   Date:  02/12/2024  ID:  Mike Arias, DOB [birth year], MRN [id number] PCP: Mike Hasty, DO  [facility] Providers Cardiologist:  Mike Alert, MD Electrophysiologist:  Mike Byes, MD {  History of Present Illness: .   Mike Arias is a 73 y.o. male w/PMHx of  TIA (s), HLD  Following over the years with Dr. Alroy Arias w/hx of TIAs, and HLD, intermittent reports of CP with no significant cardiac findings At his most recent visit 10/28/23, he was found in asymptomatic AFib. Started on Eliquis , planned for monitor to assess paroxysmal vs persistent and plans pending the finding, DCCV vs AAD  Referred to the AFib clinic, saw the 3/5, was in SR, reported on a quail hunt recently, unusually DOE with trecking through the trails, suspected from AFib No changes or recommendations made.  Dr. Alroy Arias referred to Dr. Marven Arias for management strategies, described more symptoms at this visit with AFib Discussed management strategies, planned for ablation  Pt subsequently messaged with intolerable fatigue attributed to AFib, unable to move up his ablation date and given an appt to come in to be seen for management options presumably to bridge to his ablation  ROS:   He reports feeling fairly miserable, very poor energy. Denies lightheaded or dizzy, but very tired. Did his usual swim and was able to finish but really tired afterwards No SOB Reports some GERD pain on/off (not new) No CP with his swimming, exercise No palpitations No bleeding or signs of bleeding  He is in SR today > despite that tells me he feel very tired, running at about 75%" He has never had palpitations or cardiac awareness with his AFib, but assumes the fatigue is because of AFib  Arrhythmia/AAD hx AFib found Feb 2025 No AAD to date  Studies Reviewed: Mike Arias    EKG done today and reviewed by myself:  SR 66bpm, ~ 1st degree AVblock, , [facility]  December 07, 2023 ZIO monitor  personally reviewed 5% burden of atrial fibrillation, occasional PVCs, occasional PACs  Echo 10/31/23  1. Left ventricular ejection fraction, by estimation, is 65 to 70%. The  left ventricle has normal function. The left ventricle has no regional  wall motion abnormalities. Left ventricular diastolic parameters are  consistent with Grade II diastolic  dysfunction (pseudonormalization). The average left ventricular global  longitudinal strain is -21.0 %. The global longitudinal strain is normal.   2. Right ventricular systolic function is normal. The right ventricular  size is normal.   3. Left atrial size was moderately dilated.   4. Right atrial size was mildly dilated.   5. The mitral valve is normal in structure. Trivial mitral valve  regurgitation. No evidence of mitral stenosis.   6. The aortic valve is normal in structure. Aortic valve regurgitation is  not visualized. Aortic valve sclerosis/calcification is present, without  any evidence of aortic stenosis.   7. Aortic dilatation noted. There is moderate dilatation of the aortic  root, measuring 44 mm. There is borderline dilatation of the ascending  aorta, measuring 38 mm.   8. The inferior vena cava is normal in size with greater than 50%  respiratory variability, suggesting right atrial pressure of 3 mmHg. \   06/03/23: EST  Pt walked for 8 minutes of a standard Bruce protocol GXT. He achieved a peak HR of 129 which is 86% or predicted maximal HR.  At peak exerccise, he had no ST or T wave changes to suggest  ischemia. His BP response to exercise was normal .  No significant arrhythmias during the GXT   Normal GXT   04/17/22: coronary Ca score FINDINGS: Coronary arteries: Normal origins.   Coronary Calcium  Score:   Left main: 0   Left anterior descending artery: 51.2   Left circumflex artery: 32.1   Right coronary artery: 71   Total: 154   Percentile: 54   Pericardium: Normal.   Ascending Aorta: Normal  caliber.   Non-cardiac: See separate report from [facility].   IMPRESSION: Coronary calcium  score of 154. This was 46 percentile for age-, race-, and sex-matched controls.   Risk Assessment/Calculations:    Physical Exam:   VS:  There were no vitals taken for this visit.   Wt Readings from Last 3 Encounters:  01/26/24 194 lb (88 kg)  12/10/23 193 lb (87.5 kg)  11/26/23 196 lb (88.9 kg)    GEN: Well nourished, well developed in no acute distress, no palor NECK: No JVD; No carotid bruits CARDIAC: RRR, no murmurs, rubs, gallops RESPIRATORY:  CTA b/l without rales, wheezing or rhonchi  ABDOMEN: Soft, non-tender, non-distended EXTREMITIES:  No edema; No deformity   ASSESSMENT AND PLAN: .    paroxysmal AFib CHA2DS2Vasc is 3, on Eliquis , appropriately dosed  Long discussion with him today Despite NSR today he does not feel great, BP and HR are good We discussed AT LENGTH today   Can presume that he may have been in AFib tle last few days when feeling so poorly and may have recently converted but not yet feeling back to baseline Symptoms not from AFib He denies any symptoms of illness, no overt bleeding or signs of bleeding   Will proceed that he has likely been in AFib developed symptoms c/w Afib He was thrilled to hear that they were able to move up his ablation date He would like a strategy in place in the meantime to avoid AFib  Discussed flecainide and amiodarone as bridge to ablation He would like to avoid the need for f/u EKG, stress test for flecainide Discussed potential side effects of amiodarone, and that would anticipate this a short course.  He would like to proceed with amiodarone 400mg  daily for a week then 200mg  daily Baseline labs today for amio as well as pre-procedure  He has plans for a couple fishing trips, discussed sin sensitivity with amio and advised sunscreen, hat/long sleeves  We reviewed day of ablation expectations and post procedure  recovery He reports excellent medication compliance, we discussed importance of eliquis  compliance  Discussed that in the next few days or so if he is not feeling better or if he feels any worse he should see his PMD for evaluation of other etiologies of fatigue (given historically has been asymptomatic with AFib  Secondary hypercoagulable state 2/2 AFib   Dispo: usual post ablation follow up  Signed, Debbie Fails, PA-C

## 2024-02-12 NOTE — Patient Instructions (Addendum)
 Medication Instructions:   START TAKING : AMIODARONE 200 MG   FOR ONE WEEK  ONLY : TAKE 400 MG ONCE A DAY    THEN RESUME 200 MG ONCE A DAY    *If you need a refill on your cardiac medications before your next appointment, please call your pharmacy*  Lab Work:  PLEASE GO DOWN STAIRS  [facility]  FIRST FLOOR   ( GET OFF ELEVATORS WALK TOWARDS WAITING AREA LAB LOCATED BY PHARMACY):  BMET AND CBC TODAY     If you have labs (blood work) drawn today and your tests are completely normal, you will receive your results only by: MyChart Message (if you have MyChart) OR A paper copy in the mail If you have any lab test that is abnormal or we need to change your treatment, we will call you to review the results.  Testing/Procedures:  SEE LETTERS  FOR SCHEDULED PROCEDURE    Follow-Up: At [facility], you and your health needs are our priority.  As part of our continuing mission to provide you with exceptional heart care, our providers are all part of one team.  This team includes your primary Cardiologist (physician) and Advanced Practice Providers or APPs (Physician Assistants and Nurse Practitioners) who all work together to provide you with the care you need, when you need it.   Your next appointment:  SOMEONE FROM OUR SCHEDULING DEPARTMENT WILL CALL YOU TO SCHEDULE YOUR FOLLOW UP    We recommend signing up for the patient portal called "MyChart".  Sign up information is provided on this After Visit Summary.  MyChart is used to connect with patients for Virtual Visits (Telemedicine).  Patients are able to view lab/test results, encounter notes, upcoming appointments, etc.  Non-urgent messages can be sent to your provider as well.   To learn more about what you can do with MyChart, go to [email or web address].   Other Instructions

## 2024-02-13 ENCOUNTER — Ambulatory Visit: Payer: Self-pay | Admitting: Physician Assistant

## 2024-02-13 LAB — COMPREHENSIVE METABOLIC PANEL WITH GFR
ALT: 28 IU/L (ref 0–44)
AST: 27 IU/L (ref 0–40)
Albumin: 4.3 g/dL (ref 3.8–4.8)
Alkaline Phosphatase: 66 IU/L (ref 44–121)
BUN/Creatinine Ratio: 17 (ref 10–24)
BUN: 17 mg/dL (ref 8–27)
Bilirubin Total: 0.3 mg/dL (ref 0.0–1.2)
CO2: 21 mmol/L (ref 20–29)
Calcium: 10.4 mg/dL — ABNORMAL HIGH (ref 8.6–10.2)
Chloride: 97 mmol/L (ref 96–106)
Creatinine, Ser: 0.98 mg/dL (ref 0.76–1.27)
Globulin, Total: 1.9 g/dL (ref 1.5–4.5)
Glucose: 105 mg/dL — ABNORMAL HIGH (ref 70–99)
Potassium: 5.1 mmol/L (ref 3.5–5.2)
Sodium: 133 mmol/L — ABNORMAL LOW (ref 134–144)
Total Protein: 6.2 g/dL (ref 6.0–8.5)
eGFR: 82 mL/min/{1.73_m2} (ref >59)

## 2024-02-13 LAB — CBC
Hematocrit: 43.4 % (ref 37.5–51.0)
Hemoglobin: 14.5 g/dL (ref 13.0–17.7)
MCH: 33.7 pg — ABNORMAL HIGH (ref 26.6–33.0)
MCHC: 33.4 g/dL (ref 31.5–35.7)
MCV: 101 fL — ABNORMAL HIGH (ref 79–97)
Platelets: 135 10*3/uL — ABNORMAL LOW (ref 150–450)
RBC: 4.3 x10E6/uL (ref 4.14–5.80)
RDW: 13.1 % (ref 11.6–15.4)
WBC: 5.6 10*3/uL (ref 3.4–10.8)

## 2024-02-13 LAB — TSH: TSH: 1.83 u[IU]/mL (ref 0.450–4.500)

## 2024-02-24 ENCOUNTER — Ambulatory Visit (HOSPITAL_COMMUNITY)
Admission: RE | Admit: 2024-02-24 | Discharge: 2024-02-24 | Disposition: A | Discharge status: Home / Self Care | Source: Ambulatory Visit | Attending: Cardiology | Admitting: Cardiology

## 2024-02-24 DIAGNOSIS — I480 Paroxysmal atrial fibrillation: Secondary | ICD-10-CM | POA: Diagnosis not present

## 2024-02-24 MED ORDER — IOHEXOL 350 MG/ML SOLN
100.0000 mL | Freq: Once | INTRAVENOUS | Status: AC | PRN
  Administered 2024-02-24: 100 mL via INTRAVENOUS

## 2024-03-02 ENCOUNTER — Telehealth (HOSPITAL_COMMUNITY): Payer: Self-pay

## 2024-03-02 NOTE — Telephone Encounter (Signed)
 Spoke with patient to discuss upcoming procedure.   CT: completed.  Labs: completed.   Any recent signs of acute illness or been started on antibiotics? No Any new medications started? No Any medications to hold? No  Any missed doses of blood thinner? Patient states, "I can't say that I haven't missed any doses of Eliquis ", but denies missing any doses of medication within the past 4 weeks.  Advised patient it is important to continue taking ANTICOAGULANT: Eliquis  (Apixaban ) twice daily without missing any doses. He voiced understanding. Medication instructions:  On the morning of your procedure DO NOT take any medication., including Eliquis  or the procedure may be rescheduled. Nothing to eat or drink after midnight prior to your procedure.  Confirmed patient is scheduled for Atrial Fibrillation Ablation on Tuesday, June 17 with Dr. Harvie Liner. Instructed patient to arrive at the Main Entrance A at [facility]: [address] [place], [place] [address] and check in at Admitting at 5:30 AM.  Advised of plan to go home the same day and will only stay overnight if medically necessary. You MUST have a responsible adult to drive you home and MUST be with you the first 24 hours after you arrive home or your procedure could be cancelled.  Patient verbalized understanding to all instructions provided and agreed to proceed with procedure.   Patient expressed some difficulty with accessing MyChart. Offered to provide patient the MyChart support number but he was unable to take information. Offered to transfer patient and he was agreeable to this. Patient disconnected the call within one minute of nurse trying to reach someone.

## 2024-03-04 DIAGNOSIS — H57813 Brow ptosis, bilateral: Secondary | ICD-10-CM | POA: Diagnosis not present

## 2024-03-04 DIAGNOSIS — H0279 Other degenerative disorders of eyelid and periocular area: Secondary | ICD-10-CM | POA: Diagnosis not present

## 2024-03-04 DIAGNOSIS — H02831 Dermatochalasis of right upper eyelid: Secondary | ICD-10-CM | POA: Diagnosis not present

## 2024-03-04 DIAGNOSIS — H02413 Mechanical ptosis of bilateral eyelids: Secondary | ICD-10-CM | POA: Diagnosis not present

## 2024-03-04 DIAGNOSIS — H02834 Dermatochalasis of left upper eyelid: Secondary | ICD-10-CM | POA: Diagnosis not present

## 2024-03-08 NOTE — Pre-Procedure Instructions (Signed)
 Instructed patient on the following items: Arrival time 0515 Nothing to eat or drink after midnight No meds AM of procedure Responsible person to drive you home and stay with you for 24 hrs  Have you missed any doses of anti-coagulant Eliquis- takes twice a day, hasn't missed any doses in last 4 weeks.  Don't take dose morning of procedure.

## 2024-03-08 NOTE — Anesthesia Preprocedure Evaluation (Signed)
 Anesthesia Evaluation  Patient identified by MRN, date of birth, ID band Patient awake    Reviewed: Allergy & Precautions, NPO status , Patient's Chart, lab work & pertinent test results  Airway Mallampati: III  TM Distance: >3 FB Neck ROM: Full    Dental  (+) Teeth Intact, Dental Advisory Given   Pulmonary former smoker Snores at night, no sleep study    Pulmonary exam normal breath sounds clear to auscultation       Cardiovascular + Peripheral Vascular Disease  Normal cardiovascular exam+ dysrhythmias (eiquis) Atrial Fibrillation  Rhythm:Irregular Rate:Normal     Neuro/Psych TIA negative psych ROS   GI/Hepatic Neg liver ROS,GERD  Controlled,,  Endo/Other  negative endocrine ROS    Renal/GU negative Renal ROS     Musculoskeletal  (+) Arthritis , Osteoarthritis,    Abdominal   Peds  Hematology negative hematology ROS (+)   Anesthesia Other Findings   Reproductive/Obstetrics negative OB ROS                             Anesthesia Physical Anesthesia Plan  ASA: 2  Anesthesia Plan: General   Post-op Pain Management: Minimal or no pain anticipated   Induction: Intravenous  PONV Risk Score and Plan: 2 and Ondansetron, Dexamethasone and Treatment may vary due to age or medical condition  Airway Management Planned: Oral ETT  Additional Equipment: None  Intra-op Plan:   Post-operative Plan: Extubation in OR  Informed Consent: I have reviewed the patients History and Physical, chart, labs and discussed the procedure including the risks, benefits and alternatives for the proposed anesthesia with the patient or authorized representative who has indicated his/her understanding and acceptance.     Dental advisory given  Plan Discussed with: CRNA  Anesthesia Plan Comments:         Anesthesia Quick Evaluation

## 2024-03-09 ENCOUNTER — Ambulatory Visit (HOSPITAL_COMMUNITY): Payer: Self-pay | Admitting: Anesthesiology

## 2024-03-09 ENCOUNTER — Ambulatory Visit (HOSPITAL_COMMUNITY)
Admission: RE | Disposition: A | Discharge status: Home / Self Care | Payer: Self-pay | Source: Home / Self Care | Attending: Cardiology

## 2024-03-09 ENCOUNTER — Ambulatory Visit (HOSPITAL_COMMUNITY)
Admission: RE | Admit: 2024-03-09 | Discharge: 2024-03-09 | Disposition: A | Discharge status: Home / Self Care | Attending: Cardiology | Admitting: Cardiology

## 2024-03-09 ENCOUNTER — Other Ambulatory Visit (HOSPITAL_COMMUNITY): Payer: Self-pay

## 2024-03-09 DIAGNOSIS — I739 Peripheral vascular disease, unspecified: Secondary | ICD-10-CM | POA: Diagnosis not present

## 2024-03-09 DIAGNOSIS — Z8673 Personal history of transient ischemic attack (TIA), and cerebral infarction without residual deficits: Secondary | ICD-10-CM | POA: Diagnosis not present

## 2024-03-09 DIAGNOSIS — I4891 Unspecified atrial fibrillation: Secondary | ICD-10-CM

## 2024-03-09 DIAGNOSIS — I480 Paroxysmal atrial fibrillation: Secondary | ICD-10-CM | POA: Diagnosis not present

## 2024-03-09 DIAGNOSIS — K219 Gastro-esophageal reflux disease without esophagitis: Secondary | ICD-10-CM | POA: Diagnosis not present

## 2024-03-09 DIAGNOSIS — Z87891 Personal history of nicotine dependence: Secondary | ICD-10-CM | POA: Insufficient documentation

## 2024-03-09 DIAGNOSIS — Z7901 Long term (current) use of anticoagulants: Secondary | ICD-10-CM | POA: Insufficient documentation

## 2024-03-09 DIAGNOSIS — E785 Hyperlipidemia, unspecified: Secondary | ICD-10-CM

## 2024-03-09 HISTORY — PX: ATRIAL FIBRILLATION ABLATION: EP1191

## 2024-03-09 LAB — POCT ACTIVATED CLOTTING TIME: Activated Clotting Time: 262 s

## 2024-03-09 SURGERY — ATRIAL FIBRILLATION ABLATION
Anesthesia: General

## 2024-03-09 MED ORDER — COLCHICINE 0.6 MG PO TABS
0.6000 mg | ORAL_TABLET | Freq: Two times a day (BID) | ORAL | 0 refills | Status: DC
Start: 2024-03-09 — End: 2024-04-06
  Filled 2024-03-09: qty 10, 5d supply, fill #0

## 2024-03-09 MED ORDER — PHENYLEPHRINE HCL-NACL 20-0.9 MG/250ML-% IV SOLN
INTRAVENOUS | Status: DC | PRN
  Administered 2024-03-09: 20 ug/min via INTRAVENOUS

## 2024-03-09 MED ORDER — ONDANSETRON HCL 4 MG/2ML IJ SOLN: 4.0000 mg | Freq: Four times a day (QID) | INTRAMUSCULAR | Status: DC | PRN

## 2024-03-09 MED ORDER — SUGAMMADEX SODIUM 200 MG/2ML IV SOLN
INTRAVENOUS | Status: DC | PRN
  Administered 2024-03-09: 200 mg via INTRAVENOUS

## 2024-03-09 MED ORDER — FENTANYL CITRATE (PF) 100 MCG/2ML IJ SOLN
INTRAMUSCULAR | Status: AC
Start: 2024-03-09 — End: 2024-03-09
  Filled 2024-03-09: qty 2

## 2024-03-09 MED ORDER — ATROPINE SULFATE 1 MG/ML IV SOLN
INTRAVENOUS | Status: DC | PRN
  Administered 2024-03-09: 1 mg via INTRAVENOUS

## 2024-03-09 MED ORDER — HEPARIN SODIUM (PORCINE) 1000 UNIT/ML IJ SOLN
INTRAMUSCULAR | Status: DC | PRN
  Administered 2024-03-09: 7000 [IU] via INTRAVENOUS
  Administered 2024-03-09: 14000 [IU] via INTRAVENOUS

## 2024-03-09 MED ORDER — SODIUM CHLORIDE 0.9% FLUSH: 3.0000 mL | INTRAVENOUS | Status: DC | PRN

## 2024-03-09 MED ORDER — PROPOFOL 1000 MG/100ML IV EMUL
INTRAVENOUS | Status: AC
  Filled 2024-03-09: qty 100

## 2024-03-09 MED ORDER — PANTOPRAZOLE SODIUM 40 MG PO TBEC
40.0000 mg | DELAYED_RELEASE_TABLET | Freq: Every day | ORAL | Status: DC
  Administered 2024-03-09: 40 mg via ORAL
  Filled 2024-03-09: qty 1

## 2024-03-09 MED ORDER — PROPOFOL 500 MG/50ML IV EMUL
INTRAVENOUS | Status: DC | PRN
Start: 2024-03-09 — End: 2024-03-09
  Administered 2024-03-09: 75 ug/kg/min via INTRAVENOUS

## 2024-03-09 MED ORDER — ROCURONIUM BROMIDE 10 MG/ML (PF) SYRINGE
PREFILLED_SYRINGE | INTRAVENOUS | Status: DC | PRN
  Administered 2024-03-09: 70 mg via INTRAVENOUS
  Administered 2024-03-09: 20 mg via INTRAVENOUS

## 2024-03-09 MED ORDER — HEPARIN (PORCINE) IN NACL 1000-0.9 UT/500ML-% IV SOLN
INTRAVENOUS | Status: DC | PRN
Start: 2024-03-09 — End: 2024-03-09
  Administered 2024-03-09 (×3): 500 mL

## 2024-03-09 MED ORDER — ACETAMINOPHEN 325 MG PO TABS
650.0000 mg | ORAL_TABLET | ORAL | Status: DC | PRN
  Administered 2024-03-09: 650 mg via ORAL

## 2024-03-09 MED ORDER — PHENYLEPHRINE 80 MCG/ML (10ML) SYRINGE FOR IV PUSH (FOR BLOOD PRESSURE SUPPORT)
PREFILLED_SYRINGE | INTRAVENOUS | Status: DC | PRN
  Administered 2024-03-09: 160 ug via INTRAVENOUS

## 2024-03-09 MED ORDER — APIXABAN 5 MG PO TABS
5.0000 mg | ORAL_TABLET | Freq: Two times a day (BID) | ORAL | Status: DC
  Administered 2024-03-09: 5 mg via ORAL
  Filled 2024-03-09: qty 1

## 2024-03-09 MED ORDER — SODIUM CHLORIDE 0.9% FLUSH
3.0000 mL | Freq: Two times a day (BID) | INTRAVENOUS | Status: DC
Start: 2024-03-09 — End: 2024-03-09

## 2024-03-09 MED ORDER — PROPOFOL 10 MG/ML IV BOLUS
INTRAVENOUS | Status: DC | PRN
  Administered 2024-03-09: 50 mg via INTRAVENOUS
  Administered 2024-03-09: 150 mg via INTRAVENOUS

## 2024-03-09 MED ORDER — SODIUM CHLORIDE 0.9 % IV SOLN: 250.0000 mL | INTRAVENOUS | Status: DC | PRN

## 2024-03-09 MED ORDER — PANTOPRAZOLE SODIUM 40 MG PO TBEC
40.0000 mg | DELAYED_RELEASE_TABLET | Freq: Every day | ORAL | 0 refills | Status: AC
  Filled 2024-03-09: qty 45, 45d supply, fill #0

## 2024-03-09 MED ORDER — FENTANYL CITRATE (PF) 250 MCG/5ML IJ SOLN
INTRAMUSCULAR | Status: DC | PRN
  Administered 2024-03-09 (×2): 50 ug via INTRAVENOUS

## 2024-03-09 MED ORDER — SODIUM CHLORIDE 0.9 % IV SOLN: INTRAVENOUS | Status: DC

## 2024-03-09 MED ORDER — LIDOCAINE 2% (20 MG/ML) 5 ML SYRINGE
INTRAMUSCULAR | Status: DC | PRN
  Administered 2024-03-09: 80 mg via INTRAVENOUS

## 2024-03-09 MED ORDER — DEXAMETHASONE SODIUM PHOSPHATE 10 MG/ML IJ SOLN
INTRAMUSCULAR | Status: DC | PRN
  Administered 2024-03-09: 10 mg via INTRAVENOUS

## 2024-03-09 MED ORDER — ACETAMINOPHEN 325 MG PO TABS
ORAL_TABLET | ORAL | Status: AC
  Filled 2024-03-09: qty 2

## 2024-03-09 MED ORDER — ONDANSETRON HCL 4 MG/2ML IJ SOLN
INTRAMUSCULAR | Status: DC | PRN
Start: 2024-03-09 — End: 2024-03-09
  Administered 2024-03-09: 4 mg via INTRAVENOUS

## 2024-03-09 MED ORDER — COLCHICINE 0.6 MG PO TABS
0.6000 mg | ORAL_TABLET | Freq: Two times a day (BID) | ORAL | Status: DC
  Administered 2024-03-09: 0.6 mg via ORAL
  Filled 2024-03-09: qty 1

## 2024-03-09 MED ORDER — PROTAMINE SULFATE 10 MG/ML IV SOLN
INTRAVENOUS | Status: DC | PRN
  Administered 2024-03-09: 40 mg via INTRAVENOUS

## 2024-03-09 SURGICAL SUPPLY — 18 items
BAG SNAP BAND KOVER 36X36 (MISCELLANEOUS) IMPLANT
CABLE PFA RX CATH CONN (CABLE) IMPLANT
CATH FARAWAVE ABLATION 31 (CATHETERS) IMPLANT
CATH GE 8FR SOUNDSTAR (CATHETERS) IMPLANT
CATH OCTARAY 2.0 F 3-3-3-3-3 (CATHETERS) IMPLANT
CATH WEBSTER BI DIR CS D-F CRV (CATHETERS) IMPLANT
CLOSURE PERCLOSE PROSTYLE (VASCULAR PRODUCTS) IMPLANT
COVER SWIFTLINK CONNECTOR (BAG) ×1 IMPLANT
DILATOR VESSEL 38 20CM 16FR (INTRODUCER) IMPLANT
GUIDEWIRE INQWIRE 1.5J.035X260 (WIRE) IMPLANT
KIT VERSACROSS CNCT FARADRIVE (KITS) IMPLANT
PACK EP LF (CUSTOM PROCEDURE TRAY) ×1 IMPLANT
PAD DEFIB RADIO PHYSIO CONN (PAD) ×1 IMPLANT
PATCH CARTO3 (PAD) IMPLANT
SHEATH FARADRIVE STEERABLE (SHEATH) IMPLANT
SHEATH PINNACLE 8F 10CM (SHEATH) IMPLANT
SHEATH PINNACLE 9F 10CM (SHEATH) IMPLANT
SHEATH PROBE COVER 6X72 (BAG) IMPLANT

## 2024-03-09 NOTE — Discharge Instructions (Signed)

## 2024-03-09 NOTE — Transfer of Care (Signed)
 Immediate Anesthesia Transfer of Care Note  Patient: Mike Arias  Procedure(s) Performed: ATRIAL FIBRILLATION ABLATION  Patient Location: PACU and Cath Lab  Anesthesia Type:General  Level of Consciousness: awake and alert   Airway & Oxygen Therapy: Patient Spontanous Breathing and Patient connected to nasal cannula oxygen  Post-op Assessment: Report given to RN and Post -op Vital signs reviewed and stable  Post vital signs: stable  Last Vitals:  Vitals Value Taken Time  BP    Temp    Pulse 64 03/09/24 08:54  Resp 15 03/09/24 08:54  SpO2 97 % 03/09/24 08:54  Vitals shown include unfiled device data.  Last Pain:  Vitals:   03/09/24 0607  TempSrc:   PainSc: 0-No pain         Complications: There were no known notable events for this encounter.

## 2024-03-09 NOTE — Anesthesia Procedure Notes (Signed)
 Procedure Name: Intubation Date/Time: 03/09/2024 7:38 AM  Performed by: Mabel Savage, CRNAPre-anesthesia Checklist: Patient identified, Emergency Drugs available, Suction available, Patient being monitored and Timeout performed Patient Re-evaluated:Patient Re-evaluated prior to induction Oxygen Delivery Method: Circle system utilized Preoxygenation: Pre-oxygenation with 100% oxygen Induction Type: IV induction Ventilation: Mask ventilation without difficulty Laryngoscope Size: McGrath and 3 Grade View: Grade I Tube type: Oral Tube size: 7.0 mm Number of attempts: 1 Airway Equipment and Method: Stylet and Video-laryngoscopy Placement Confirmation: ETT inserted through vocal cords under direct vision, positive ETCO2, CO2 detector and breath sounds checked- equal and bilateral Secured at: 21 cm Tube secured with: Tape Dental Injury: Teeth and Oropharynx as per pre-operative assessment

## 2024-03-09 NOTE — Anesthesia Postprocedure Evaluation (Signed)
 Anesthesia Post Note  Patient: Mike Arias  Procedure(s) Performed: ATRIAL FIBRILLATION ABLATION     Patient location during evaluation: PACU Anesthesia Type: General Level of consciousness: awake and alert Pain management: pain level controlled Vital Signs Assessment: post-procedure vital signs reviewed and stable Respiratory status: spontaneous breathing, nonlabored ventilation, respiratory function stable and patient connected to nasal cannula oxygen Cardiovascular status: blood pressure returned to baseline and stable Postop Assessment: no apparent nausea or vomiting Anesthetic complications: no   There were no known notable events for this encounter.  Last Vitals:  Vitals:   03/09/24 1100 03/09/24 1130  BP: (!) 158/84 (!) 154/89  Pulse: (!) 47 (!) 50  Resp: 12 12  Temp:    SpO2: 97% 98%    Last Pain:  Vitals:   03/09/24 1004  TempSrc:   PainSc: 4    Pain Goal:                   Leslye Rast

## 2024-03-09 NOTE — H&P (Signed)
 Electrophysiology Office Note:     Date:  03/09/2024    ID:  Mike Arias, DOB Apr 10, 1951, MRN [id number]   [facility] Cardiologist:  Ahmad Alert, MD  [facility] Electrophysiologist:  Boyce Byes, MD    Referring MD: Alroy Aspen Lela Purple, MD    Chief Complaint: Atrial fibrillation   History of Present Illness:     [name] is a 73 year old man who I am seeing today for an evaluation of atrial fibrillation at the request of Mike Arias.  The patient has a history of TIA, hyperlipidemia, paroxysmal atrial fibrillation.  He was previously followed by Dr. Alroy Aspen.  He has a CHA2DS2-VASc of 3 and takes Eliquis  for stroke prophylaxis. He last saw Mike Arias in clinic November 26, 2023.  At that appointment he was doing well and relatively asymptomatic with his atrial fibrillation.  There was correspondence between him and Dr. Alroy Aspen discussing rhythm control for atrial fibrillation and the patient presents today to discuss catheter ablation for his arrhythmia.   He is very active man.  He tells me that when he Mike Arias hunts or if he is playing golf he will feel like his heart is going beyond his limits.  He does not appreciate the actual palpitations.  No syncope or presyncope.  No chest pain.  He just reports intermittent shortness of breath and decreased exercise tolerance.  He swims regularly.  He is on Eliquis  for stroke prophylaxis.   Presents for AF ablation. Procedure reviewed.     Objective Their past medical, social and family history was reviewed.     ROS:   Please see the history of present illness.    All other systems reviewed and are negative.   EKGs/Labs/Other Studies Reviewed:     The following studies were reviewed today:   October 31, 2023 echo EF 65-70 RV normal Moderately dilated left atrium Trivial MR   December 07, 2023 ZIO monitor personally reviewed 5% burden of atrial fibrillation, occasional PVCs, occasional PACs     November 26, 2023 EKG shows sinus  rhythm, frequent monomorphic PVCs.  QTc 396 ms.        Physical Exam:     VS:  BP 152/93   Pulse 50   Ht 6' 1 (1.854 m)   Wt 194 lb (88 kg)   SpO2 94%   BMI 25.60 kg/m         Wt Readings from Last 3 Encounters:  01/26/24 194 lb (88 kg)  12/10/23 193 lb (87.5 kg)  11/26/23 196 lb (88.9 kg)      GEN: no distress CARD: RRR, No MRG RESP: No IWOB. CTAB.         Assessment ASSESSMENT AND PLAN:     1. Paroxysmal atrial fibrillation (HCC)   2. TIA (transient ischemic attack)         #Paroxysmal atrial fibrillation Overall low burden, 5% on most recent ZIO monitor On Eliquis  for stroke prophylaxis given a CHA2DS2-VASc of 3 We discussed his atrial fibrillation in detail during today's office visit.  We discussed rhythm control versus rate control.  We discussed the pros and cons of each approach.  We discussed data supporting rhythm control in patients with atrial fibrillation.   Discussed treatment options today for AF including antiarrhythmic drug therapy and ablation. Discussed risks, recovery and likelihood of success with each treatment strategy. Risk, benefits, and alternatives to EP study and ablation for afib were discussed. These risks include but are not limited to stroke,  bleeding, vascular damage, tamponade, perforation, damage to the esophagus, lungs, phrenic nerve and other structures, pulmonary vein stenosis, worsening renal function, coronary vasospasm and death.  Discussed potential need for repeat ablation procedures and antiarrhythmic drugs after an initial ablation. The patient understands these risk and wishes to proceed.  We will therefore proceed with catheter ablation at the next available time.  Carto, ICE, anesthesia are requested for the procedure.  Will also obtain CT PV protocol prior to the procedure to exclude LAA thrombus and further evaluate atrial anatomy.   #History of TIA Continue Eliquis  for stroke prophylaxis    Presents for AF ablation.  Procedure reviewed.    Signed, Leanora Prophet. Marven Slimmer, MD, [facility], [facility] 03/09/2024 Electrophysiology [facility]

## 2024-03-09 NOTE — Progress Notes (Signed)
 Patient ambulated to bathroom. Bilateral  groins remain unremarkable.

## 2024-03-10 ENCOUNTER — Encounter (HOSPITAL_COMMUNITY): Payer: Self-pay | Admitting: Cardiology

## 2024-03-10 MED FILL — Fentanyl Citrate Preservative Free (PF) Inj 100 MCG/2ML: INTRAMUSCULAR | Qty: 2 | Status: AC

## 2024-03-10 MED FILL — Propofol IV Emul 1000 MG/100ML (10 MG/ML): INTRAVENOUS | Qty: 56.04 | Status: AC

## 2024-03-19 ENCOUNTER — Other Ambulatory Visit (HOSPITAL_COMMUNITY)

## 2024-03-22 DIAGNOSIS — H53483 Generalized contraction of visual field, bilateral: Secondary | ICD-10-CM | POA: Diagnosis not present

## 2024-03-24 DIAGNOSIS — Z1329 Encounter for screening for other suspected endocrine disorder: Secondary | ICD-10-CM | POA: Diagnosis not present

## 2024-03-24 DIAGNOSIS — E291 Testicular hypofunction: Secondary | ICD-10-CM | POA: Diagnosis not present

## 2024-03-30 DIAGNOSIS — E291 Testicular hypofunction: Secondary | ICD-10-CM | POA: Diagnosis not present

## 2024-03-31 DIAGNOSIS — E291 Testicular hypofunction: Secondary | ICD-10-CM | POA: Diagnosis not present

## 2024-04-05 ENCOUNTER — Telehealth: Payer: Self-pay | Admitting: *Deleted

## 2024-04-05 NOTE — Telephone Encounter (Signed)
   Pre-operative Risk Assessment    Patient Name: Mike Arias  DOB: 07/16/1951 MRN: [id number]   Date of last office visit: 02/12/24 CHARLIES ARTHUR, [facility] Date of next office visit: 05/25/24 JODIE PASSEY, [facility]   Request for Surgical Clearance    Procedure:  B/L MID-FOREHEAD EYEBROW LIFT  Date of Surgery:  Clearance 05/10/24                                Surgeon:  DR. OSBORNE PONDER Surgeon's Group or Practice Name:  [facility] Phone number:  [phone] Fax number:  [phone]   Type of Clearance Requested:   - Medical  - Pharmacy:  Hold Apixaban  (Eliquis )     Type of Anesthesia:  MAC   Additional requests/questions:    Bonney Niels Jest   04/05/2024, 11:00 AM

## 2024-04-06 ENCOUNTER — Ambulatory Visit (HOSPITAL_COMMUNITY)
Admission: RE | Admit: 2024-04-06 | Discharge: 2024-04-06 | Disposition: A | Discharge status: Home / Self Care | Source: Ambulatory Visit | Attending: Internal Medicine | Admitting: Internal Medicine

## 2024-04-06 VITALS — BP 94/64 | HR 69 | Ht 72.0 in | Wt 193.0 lb

## 2024-04-06 DIAGNOSIS — D6869 Other thrombophilia: Secondary | ICD-10-CM | POA: Diagnosis not present

## 2024-04-06 DIAGNOSIS — I480 Paroxysmal atrial fibrillation: Secondary | ICD-10-CM | POA: Diagnosis not present

## 2024-04-06 DIAGNOSIS — I4891 Unspecified atrial fibrillation: Secondary | ICD-10-CM

## 2024-04-06 NOTE — Progress Notes (Signed)
 Primary Care Physician: Valentin Skates, DO Primary Cardiologist: Aleene Passe, MD Electrophysiologist: OLE ONEIDA HOLTS, MD     Referring Physician: Dr. Passe Mike Arias is a 73 y.o. male with a history of TIA, HLD, mild aortic root dilation, and paroxysmal atrial fibrillation who presents for consultation in the [facility]. Seen by Dr. Passe on 10/28/23 found to be in Afib; patient asymptomatic so cardiac monitor placed to determine paroxysmal or persistent. Patient is on Eliquis  for a CHADS2VASC score of 3.  On follow up 04/06/24, patient is currently in NSR. S/p Afib ablation on 03/09/24 by Dr. HOLTS. No episodes of Afib since ablation. He notes some improvement but does state his fatigue is ongoing. No chest pain or SOB. Leg sites healed without issue. No missed doses of anticoagulant.  Today, he denies symptoms of orthopnea, PND, lower extremity edema, dizziness, presyncope, syncope, snoring, daytime somnolence, bleeding, or neurologic sequela. The patient is tolerating medications without difficulties and is otherwise without complaint today.    he has a BMI of Body mass index is 26.18 kg/m.SABRA Filed Weights   04/06/24 1511  Weight: 87.5 kg     Current Outpatient Medications  Medication Sig Dispense Refill   apixaban  (ELIQUIS ) 5 MG TABS tablet Take 1 tablet (5 mg total) by mouth 2 (two) times daily. 180 tablet 3   b complex vitamins capsule Take 1 capsule by mouth daily.     Melatonin 10 MG TABS Take 1 tablet by mouth at bedtime.     rosuvastatin  (CRESTOR ) 40 MG tablet Take 40 mg by mouth daily.     tadalafil  (CIALIS ) 10 MG tablet Take 1 tablet (10 mg total) by mouth daily as needed. For erectile dysfunction 30-45 minutes prior to sexual activity (Patient taking differently: Take 10 mg by mouth as needed. For erectile dysfunction 30-45 minutes prior to sexual activity) 10 tablet 1   valACYclovir (VALTREX) 1000 MG tablet Take 1,000 mg by  mouth as needed.     pantoprazole  (PROTONIX ) 40 MG tablet Take 1 tablet (40 mg total) by mouth daily. (Patient not taking: Reported on 04/06/2024) 45 tablet 0   No current facility-administered medications for this encounter.    Atrial Fibrillation Management history:  Previous antiarrhythmic drugs: none Previous cardioversions: none Previous ablations: 03/09/24 Anticoagulation history: Eliquis    ROS- All systems are reviewed and negative except as per the HPI above.  Physical Exam: BP 94/64   Pulse 69   Ht 6' (1.829 m)   Wt 87.5 kg   BMI 26.18 kg/m   GEN- The patient is well appearing, alert and oriented x 3 today.   Neck - no JVD or carotid bruit noted Lungs- Clear to ausculation bilaterally, normal work of breathing Heart- Regular rate and rhythm, no murmurs, rubs or gallops, PMI not laterally displaced Extremities- no clubbing, cyanosis, or edema Skin - no rash or ecchymosis noted   EKG today demonstrates  Vent. rate 69 BPM PR interval 216 ms QRS duration 92 ms QT/QTcB 392/420 ms P-R-T axes 38 -21 26 Sinus rhythm with 1st degree A-V block with occasional Premature ventricular complexes Anteroseptal infarct (cited on or before 09-Mar-2024) Abnormal ECG When compared with ECG of 09-Mar-2024 08:57, Premature ventricular complexes are now Present  Echo 10/31/23 demonstrated  1. Left ventricular ejection fraction, by estimation, is 65 to 70%. The  left ventricle has normal function. The left ventricle has no regional  wall motion abnormalities. Left ventricular diastolic parameters  are  consistent with Grade II diastolic  dysfunction (pseudonormalization). The average left ventricular global  longitudinal strain is -21.0 %. The global longitudinal strain is normal.   2. Right ventricular systolic function is normal. The right ventricular  size is normal.   3. Left atrial size was moderately dilated.   4. Right atrial size was mildly dilated.   5. The mitral valve is  normal in structure. Trivial mitral valve  regurgitation. No evidence of mitral stenosis.   6. The aortic valve is normal in structure. Aortic valve regurgitation is  not visualized. Aortic valve sclerosis/calcification is present, without  any evidence of aortic stenosis.   7. Aortic dilatation noted. There is moderate dilatation of the aortic  root, measuring 44 mm. There is borderline dilatation of the ascending  aorta, measuring 38 mm.   8. The inferior vena cava is normal in size with greater than 50%  respiratory variability, suggesting right atrial pressure of 3 mmHg.   ASSESSMENT & PLAN CHA2DS2-VASc Score = 3  The patient's score is based upon: CHF History: 0 HTN History: 0 Diabetes History: 0 Stroke History: 2 Vascular Disease History: 0 Age Score: 1 Gender Score: 0       ASSESSMENT AND PLAN: Paroxysmal Atrial Fibrillation (ICD10:  I48.0) The patient's CHA2DS2-VASc score is 3, indicating a 3.2% annual risk of stroke.   S/p Afib ablation on 03/09/24.  He is currently in NSR. Reassurance provided that hopefully over next couple of months his fatigue may improve.  Secondary Hypercoagulable State (ICD10:  D68.69) The patient is at significant risk for stroke/thromboembolism based upon his CHA2DS2-VASc Score of 3.  Continue Apixaban  (Eliquis ).  Continue Eliquis  5 mg BID without interruption.     Follow up with EP as scheduled.   Terra Pac, PA-C  Afib Clinic [facility] [address] [place], [place] [address] [phone]

## 2024-04-06 NOTE — Telephone Encounter (Signed)
 Patient with diagnosis of atrial fibrillation on Eliquis  for anticoagulation.    Procedure:  B/L MID-FOREHEAD EYEBROW LIFT   Date of Surgery:  Clearance 05/10/24     CHA2DS2-VASc Score = 3   This indicates a 3.2% annual risk of stroke. The patient's score is based upon: CHF History: 0 HTN History: 0 Diabetes History: 0 Stroke History: 2 Vascular Disease History: 0 Age Score: 1 Gender Score: 0   Chart indicates possible TIA in 2020 and 2023  CrCl 87 Platelet count 135  Patient has had an Afib/aflutter ablation within the last 3 months - 03/09/24  Procedure will need to be rescheduled for after 06/09/26  Per office protocol, patient can hold Eliquis  for 2 days prior to rescheduled procedure.   Patient will not need bridging with Lovenox (enoxaparin) around procedure.  **This guidance is not considered finalized until pre-operative APP has relayed final recommendations.**

## 2024-04-07 ENCOUNTER — Telehealth: Payer: Self-pay | Admitting: Neurology

## 2024-04-07 NOTE — Telephone Encounter (Signed)
 Received sleep referral for pt from Dr. Massie Sewer at [facility] to consider if sleep apnea testing is warranted with nocturia and daytime sleepiness and fatigue. Placed in sleep referrals box

## 2024-04-07 NOTE — Telephone Encounter (Signed)
   Name:  Mike Arias  DOB:  05-18-1951  MRN:  [id number]   Primary Cardiologist: Aleene Passe, MD  Chart reviewed as part of pre-operative protocol coverage. Patient was contacted 04/07/2024 in reference to pre-operative risk assessment for pending surgery as outlined below.  [name] was last seen on 04/06/2024 by Fairy Heinrich, PA, Electrophysiology.  He is  s/p Afib ablation on 03/09/24 by Dr. Cindie   Patient with diagnosis of atrial fibrillation on Eliquis  for anticoagulation.     Procedure:  B/L MID-FOREHEAD EYEBROW LIFT   Date of Surgery:  Clearance 05/10/24       CHA2DS2-VASc Score = 3   This indicates a 3.2% annual risk of stroke. The patient's score is based upon: CHF History: 0 HTN History: 0 Diabetes History: 0 Stroke History: 2 Vascular Disease History: 0 Age Score: 1 Gender Score: 0   Chart indicates possible TIA in 2020 and 2023   CrCl 87 Platelet count 135   Patient has had an Afib/aflutter ablation within the last 3 months - 03/09/24   Procedure will need to be rescheduled for after 06/09/26   Per office protocol, patient can hold Eliquis  for 2 days prior to rescheduled procedure.   Patient will not need bridging with Lovenox (enoxaparin) around procedure.  Due to new or worsening symptoms, JERRET MCBANE will require a follow-up visit for further pre-operative risk assessment.  Pre-op covering staff: - Please schedule appointment and call patient to inform them. If patient already had an upcoming appointment within acceptable timeframe, please add pre-op clearance to the appointment notes so provider is aware. - Please contact requesting surgeon's office via preferred method (i.e, phone, fax) to inform them of need for appointment prior to surgery.  Lamarr Satterfield, NP 04/07/2024, 8:13 AM

## 2024-04-09 ENCOUNTER — Other Ambulatory Visit (HOSPITAL_COMMUNITY): Payer: Self-pay

## 2024-04-09 DIAGNOSIS — M170 Bilateral primary osteoarthritis of knee: Secondary | ICD-10-CM | POA: Diagnosis not present

## 2024-04-24 ENCOUNTER — Encounter: Payer: Self-pay | Admitting: Gastroenterology

## 2024-04-27 DIAGNOSIS — M2550 Pain in unspecified joint: Secondary | ICD-10-CM | POA: Diagnosis not present

## 2024-04-27 DIAGNOSIS — E291 Testicular hypofunction: Secondary | ICD-10-CM | POA: Diagnosis not present

## 2024-04-27 DIAGNOSIS — R5383 Other fatigue: Secondary | ICD-10-CM | POA: Diagnosis not present

## 2024-05-04 ENCOUNTER — Encounter: Payer: Self-pay | Admitting: Gastroenterology

## 2024-05-04 DIAGNOSIS — R7989 Other specified abnormal findings of blood chemistry: Secondary | ICD-10-CM | POA: Diagnosis not present

## 2024-05-04 DIAGNOSIS — R5383 Other fatigue: Secondary | ICD-10-CM | POA: Diagnosis not present

## 2024-05-04 DIAGNOSIS — M2550 Pain in unspecified joint: Secondary | ICD-10-CM | POA: Diagnosis not present

## 2024-05-04 DIAGNOSIS — E291 Testicular hypofunction: Secondary | ICD-10-CM | POA: Diagnosis not present

## 2024-05-13 DIAGNOSIS — M170 Bilateral primary osteoarthritis of knee: Secondary | ICD-10-CM | POA: Diagnosis not present

## 2024-05-18 ENCOUNTER — Telehealth: Payer: Self-pay

## 2024-05-18 DIAGNOSIS — K080 Exfoliation of teeth due to systemic causes: Secondary | ICD-10-CM | POA: Diagnosis not present

## 2024-05-18 NOTE — Telephone Encounter (Signed)
 RN was preparing patient chart for upcoming colonoscopy on 10/6 with Dr. Legrand and observed that patient has been started on Eliquis  since his last [facility] procedure. Patient did have an ablation for A-fib in June of 2025, which was successful. RN called patient on 8/26; he is still taking Eliquis .   RN informed patient that he will need to be seen by Dr. Legrand prior to his next procedure. RN is contacting Dr. Legrand' RN to notify of this appt need.   10/6 colonoscopy has been cancelled until the office appt is completed.

## 2024-05-19 NOTE — Telephone Encounter (Signed)
 10/30 at 1:40 pm appt made with Dr Legrand  The pt has been advised

## 2024-05-20 DIAGNOSIS — M170 Bilateral primary osteoarthritis of knee: Secondary | ICD-10-CM | POA: Diagnosis not present

## 2024-05-25 ENCOUNTER — Ambulatory Visit: Attending: Student | Admitting: Student

## 2024-05-25 NOTE — Progress Notes (Deleted)
  Electrophysiology Office Note:   Date:  05/25/2024  ID:  Mike Arias, DOB 1950/09/26, MRN [id number]  Primary Cardiologist: Aleene Passe, MD (Inactive) Electrophysiologist: OLE ONEIDA HOLTS, MD   Electrophysiologist:  OLE ONEIDA HOLTS, MD  {Click to update primary MD,subspecialty MD or APP then REFRESH:1}    History of Present Illness:   Mike Arias is a 73 y.o. male with h/o TIA, HLD, mild aortic root dilation, and paroxysmal atrial fibrillation seen today for routine electrophysiology follow-up s/p Ablation.  Since last being seen in our clinic the patient reports doing ***.  he denies chest pain, palpitations, dyspnea, PND, orthopnea, nausea, vomiting, dizziness, syncope, edema, weight gain, or early satiety.    Review of systems complete and found to be negative unless listed in HPI.   EP Information / Studies Reviewed:    EKG is ordered today. Personal review as below.       Arrhythmia/Device History S/p PVI and posterior wall ablation 03/09/2024   Physical Exam:   VS:  There were no vitals taken for this visit.   Wt Readings from Last 3 Encounters:  04/06/24 193 lb (87.5 kg)  03/09/24 193 lb (87.5 kg)  02/12/24 195 lb (88.5 kg)     GEN: No acute distress NECK: No JVD; No carotid bruits CARDIAC: {EPRHYTHM:28826}, no murmurs, rubs, gallops RESPIRATORY:  Clear to auscultation without rales, wheezing or rhonchi  ABDOMEN: Soft, non-tender, non-distended EXTREMITIES:  {EDEMA LEVEL:28147::No} edema; No deformity   ASSESSMENT AND PLAN:    Paroxysmal AF EKG today shows *** Continue Eliquis  5 mg BID for CHA2DS2VASc of at least 3   Secondary hypercoagulable state Pt on Eliquis  as above   {Click here to Review PMH, Prob List, Meds, Allergies, SHx, FHx  :1}   Follow up with {EPMDS:28135::EP Team} {EPFOLLOW UP:28173}  Signed, Ozell Prentice Passey, PA-C

## 2024-05-27 DIAGNOSIS — M170 Bilateral primary osteoarthritis of knee: Secondary | ICD-10-CM | POA: Diagnosis not present

## 2024-05-27 DIAGNOSIS — H43393 Other vitreous opacities, bilateral: Secondary | ICD-10-CM | POA: Diagnosis not present

## 2024-05-27 DIAGNOSIS — H524 Presbyopia: Secondary | ICD-10-CM | POA: Diagnosis not present

## 2024-05-31 DIAGNOSIS — R3915 Urgency of urination: Secondary | ICD-10-CM | POA: Diagnosis not present

## 2024-05-31 DIAGNOSIS — Z125 Encounter for screening for malignant neoplasm of prostate: Secondary | ICD-10-CM | POA: Diagnosis not present

## 2024-06-14 ENCOUNTER — Encounter

## 2024-06-28 ENCOUNTER — Encounter: Admitting: Gastroenterology

## 2024-06-28 DIAGNOSIS — H02411 Mechanical ptosis of right eyelid: Secondary | ICD-10-CM | POA: Diagnosis not present

## 2024-06-28 DIAGNOSIS — H02834 Dermatochalasis of left upper eyelid: Secondary | ICD-10-CM | POA: Diagnosis not present

## 2024-06-28 DIAGNOSIS — H02423 Myogenic ptosis of bilateral eyelids: Secondary | ICD-10-CM | POA: Diagnosis not present

## 2024-06-28 DIAGNOSIS — H02412 Mechanical ptosis of left eyelid: Secondary | ICD-10-CM | POA: Diagnosis not present

## 2024-06-28 DIAGNOSIS — H02831 Dermatochalasis of right upper eyelid: Secondary | ICD-10-CM | POA: Diagnosis not present

## 2024-06-28 DIAGNOSIS — H02413 Mechanical ptosis of bilateral eyelids: Secondary | ICD-10-CM | POA: Diagnosis not present

## 2024-06-29 DIAGNOSIS — M170 Bilateral primary osteoarthritis of knee: Secondary | ICD-10-CM | POA: Diagnosis not present

## 2024-06-30 DIAGNOSIS — S0083XA Contusion of other part of head, initial encounter: Secondary | ICD-10-CM | POA: Diagnosis not present

## 2024-07-06 DIAGNOSIS — M170 Bilateral primary osteoarthritis of knee: Secondary | ICD-10-CM | POA: Diagnosis not present

## 2024-07-21 DIAGNOSIS — M25561 Pain in right knee: Secondary | ICD-10-CM | POA: Diagnosis not present

## 2024-07-21 DIAGNOSIS — M25562 Pain in left knee: Secondary | ICD-10-CM | POA: Diagnosis not present

## 2024-07-21 DIAGNOSIS — R269 Unspecified abnormalities of gait and mobility: Secondary | ICD-10-CM | POA: Diagnosis not present

## 2024-07-22 ENCOUNTER — Ambulatory Visit: Admitting: Gastroenterology

## 2024-07-22 NOTE — Progress Notes (Deleted)
 [facility] Consult Note:  History: Mike Arias 07/22/2024  Referring provider: Valentin Skates, DO  Reason for consult/chief complaint: No chief complaint on file.  Pertinent history  Adenomatous polyps in 2018 and 2021 (Dr. Legrand)     adenomatous polyps August 2024 as well, with fair preparation quality (MiraLAX prep)  Subjective  HPI:  ***   ROS:  Review of Systems   Past Medical History: Past Medical History:  Diagnosis Date   GERD (gastroesophageal reflux disease)    HLD (hyperlipidemia)    TIA (transient ischemic attack)    ? TIA     Past Surgical History: Past Surgical History:  Procedure Laterality Date   ATRIAL FIBRILLATION ABLATION N/A 03/09/2024   Procedure: ATRIAL FIBRILLATION ABLATION;  Surgeon: Cindie Ole DASEN, MD;  Location: [facility] INVASIVE CV [facility];  Service: Cardiovascular;  Laterality: N/A;   CERVICAL SPINE SURGERY     COLONOSCOPY     ESOPHAGOGASTRODUODENOSCOPY ENDOSCOPY     TONSILLECTOMY       Family History: Family History  Problem Relation Age of Onset   Lung cancer Father    Colon cancer Neg Hx    Esophageal cancer Neg Hx    Stomach cancer Neg Hx    Rectal cancer Neg Hx    Colon polyps Neg Hx     Social History: Social History   Socioeconomic History   Marital status: Married    Spouse name: Not on file   Number of children: Not on file   Years of education: Not on file   Highest education level: Not on file  Occupational History   Not on file  Tobacco Use   Smoking status: Former    Types: Cigars   Smokeless tobacco: Never   Tobacco comments:    Former smoker 11/26/23  Vaping Use   Vaping status: Never Used  Substance and Sexual Activity   Alcohol  use: Yes    Alcohol /week: 5.0 standard drinks of alcohol     Types: 5 Cans of beer per week    Comment: 4 days a week   Drug use: No   Sexual activity: Not on file  Other Topics Concern   Not on file  Social History Narrative   Not on file    Social Drivers of Health   Financial Resource Strain: Not on file  Food Insecurity: Not on file  Transportation Needs: Not on file  Physical Activity: Not on file  Stress: Not on file  Social Connections: Not on file    Allergies: No Known Allergies  Outpatient Meds: Current Outpatient Medications  Medication Sig Dispense Refill   apixaban  (ELIQUIS ) 5 MG TABS tablet Take 1 tablet (5 mg total) by mouth 2 (two) times daily. 180 tablet 3   b complex vitamins capsule Take 1 capsule by mouth daily.     Melatonin 10 MG TABS Take 1 tablet by mouth at bedtime.     pantoprazole  (PROTONIX ) 40 MG tablet Take 1 tablet (40 mg total) by mouth daily. (Patient not taking: Reported on 04/06/2024) 45 tablet 0   rosuvastatin  (CRESTOR ) 40 MG tablet Take 40 mg by mouth daily.     tadalafil  (CIALIS ) 10 MG tablet Take 1 tablet (10 mg total) by mouth daily as needed. For erectile dysfunction 30-45 minutes prior to sexual activity (Patient taking differently: Take 10 mg by mouth as needed. For erectile dysfunction 30-45 minutes prior to sexual activity) 10 tablet 1   valACYclovir (VALTREX) 1000 MG tablet Take 1,000 mg by  mouth as needed.     No current facility-administered medications for this visit.      ___________________________________________________________________ Objective   Exam:  There were no vitals taken for this visit. Wt Readings from Last 3 Encounters:  04/06/24 193 lb (87.5 kg)  03/09/24 193 lb (87.5 kg)  02/12/24 195 lb (88.5 kg)    General: ***  Eyes: sclera anicteric, no redness ENT: oral mucosa moist without lesions, no cervical or supraclavicular lymphadenopathy CV: ***, no JVD, no peripheral edema Resp: clear to auscultation bilaterally, normal RR and effort noted GI: soft, *** tenderness, with active bowel sounds. No guarding or palpable organomegaly noted. Skin; warm and dry, no rash or jaundice noted Neuro: awake, alert and oriented x 3. Normal gross motor  function and fluent speech  Labs:  ***  Radiologic Studies:  ***  Assessment: No diagnosis found.  ***  Plan:  ***  Thank you for the courtesy of this consult.  Please call me with any questions or concerns.  Victory LITTIE Brand III  CC: Referring provider noted above

## 2024-07-27 ENCOUNTER — Encounter

## 2024-08-05 ENCOUNTER — Telehealth: Payer: Self-pay | Admitting: Gastroenterology

## 2024-08-05 NOTE — Telephone Encounter (Signed)
 Clinic nursing:    I received this staff message today from one of the [facility] nurses:  Debruler, Harlene CROME, RN  Danis, Victory CROME MOULD, MD Dr. Legrand,  Per notes, this pt has started on Eliquis  d/t A-fib and was to have an office visit with you prior to being scheduled for the colonoscopy. It appears he no showed for appt on 10/30 and has yet to be seen. Also I do not see any communication about holding Eliquis . I also do not see any prep instructions for pt. Not sure how he got scheduled for 08/10/24. Does he need to be cancelled and rescheduled for an office visit first?  Jess  _________________  This man is currently scheduled for a colonoscopy with me on 08/10/2024, but he reportedly has not been given prep instructions and did not come to the office as scheduled in late October so we could evaluate him and then communicate with his cardiologist about a hold of oral anticoagulation.  Perhaps that procedure date was originally set prior to the August [facility] nurse previsit, and then maybe it did not get canceled once they moved him to an office appointment.  If so, then that is clearly a miscommunication.  Please contact this patient and find out what he knows about this or if he is expecting to come for a colonoscopy next week.  Not likely feasible since we would need to communicate with his cardiology team about the oral anticoagulation and he needs prep instructions as well.  VEAR Legrand MD

## 2024-08-06 NOTE — Telephone Encounter (Signed)
 Brandi,  Thank you for figuring that all out.  Mr. Scheck might truly no longer need the Eliquis  at this point since he had an A-fib ablation in June.  But at the time he was seen in A-fib clinic follow-up in July, he still needed to be on Eliquis  because it had not been 3 months since the procedure.  Of course it is ultimately up to his A-fib providers to determine that, but it sounds like he might not need the Eliquis  anymore as he recalls being told.  Nevertheless, he does need follow-up with the A-fib clinic prior to being scheduled for his colonoscopy.  I would like to try to make this as easy as possible for him.  Rather than coming for an office visit with me, what he needs is follow-up in A-fib clinic.  Once he has seen those providers, they can send their note to me and we can decide the timing of his colonoscopy.  If he is doing well without recurrence of A-fib and no longer needs oral anticoagulation when he follows up with A-fib clinic, then we might be able to just set him up with an [facility] nurse previsit and schedule the procedure without needing a clinic visit with me.  So please cancel that upcoming appointment with me on December 5, recommend that he contact the A-fib clinic to schedule follow-up (if he has not done so already), and I will copy this note to his providers in the A-fib clinic so they can also reach out to him.  Thanks again,  Victory Brand, MD

## 2024-08-06 NOTE — Telephone Encounter (Signed)
 Spoke with the pt. Relayed the recommendations per provider. Pt agrees to reach out to the A-fib clinic to schedule a f/u appt. Current appt for 12/5 cancelled at this time.

## 2024-08-06 NOTE — Telephone Encounter (Signed)
 I contacted this pt to discuss what he knew about having a colonoscopy done on 08/10/2024. Pt states that he has written in his calendar that he was supposed to have a pre-visit on 08/09/2024 and then come for the colonoscopy on 08/10/2024. Pt is unsure who set up these appointments. Advised pt that his colonoscopy date was cancelled and he was scheduled to have an OV on 10/30 but he did not show up. This was to discuss him taking Eliquis  and to get clearance for this medication. Per pt he has been off of Eliquis  since August. After further review of the chart, pt has not been seen by cards since 07/15 and that OV note states, The patient is at significant risk for stroke/thromboembolism based upon his CHA2DS2-VASc Score of 3.  Continue Apixaban  (Eliquis ).  Continue Eliquis  5 mg BID without interruption. Pt had a f/u appt with cards in Sept, but was a no show.   Called pt back to further discuss this information with him. He states he remembers being told that after the ablation he did not need to take the Eliquis  any longer. Reported he was not aware of a cardiology f/u appt for September. I cancelled the colonoscopy scheduled for 11/18. I have scheduled the pt for an ov on 12/5 at 1020 to further discuss rescheduling for the colonoscopy. Pt read back date and time and verbalized understanding.

## 2024-08-09 DIAGNOSIS — H53483 Generalized contraction of visual field, bilateral: Secondary | ICD-10-CM | POA: Diagnosis not present

## 2024-08-10 ENCOUNTER — Encounter: Admitting: Gastroenterology

## 2024-08-11 DIAGNOSIS — R269 Unspecified abnormalities of gait and mobility: Secondary | ICD-10-CM | POA: Diagnosis not present

## 2024-08-11 DIAGNOSIS — M25562 Pain in left knee: Secondary | ICD-10-CM | POA: Diagnosis not present

## 2024-08-11 DIAGNOSIS — M25561 Pain in right knee: Secondary | ICD-10-CM | POA: Diagnosis not present

## 2024-08-17 DIAGNOSIS — Z23 Encounter for immunization: Secondary | ICD-10-CM | POA: Diagnosis not present

## 2024-08-17 DIAGNOSIS — I4891 Unspecified atrial fibrillation: Secondary | ICD-10-CM | POA: Diagnosis not present

## 2024-08-20 ENCOUNTER — Encounter (INDEPENDENT_AMBULATORY_CARE_PROVIDER_SITE_OTHER): Payer: Self-pay

## 2024-08-24 ENCOUNTER — Telehealth (HOSPITAL_COMMUNITY): Payer: Self-pay | Admitting: *Deleted

## 2024-08-24 NOTE — Telephone Encounter (Signed)
 Patient called and left a message on voicemail stating he was told he could stop Eliquis  and his primary care needs documentation of this medication change.  Per Mike Heinrich PA pt was last seen in July 1 month post ablation and was instructed to continue Eliquis  without interruption.  He should remain on Eliquis  due to elevated risk score. Pt was no show for 3 month follow up. Reiterated recommendations from Mike Heinrich PA and pt was adamant he was told he could stop it via phone call. Advised patient there is no documentation of this medication change he should continue Eliquis  as prescribed and keep his scheduled follow up with [name] on 12/8. Pt stated he would do just that.

## 2024-08-27 ENCOUNTER — Ambulatory Visit: Admitting: Gastroenterology

## 2024-08-30 ENCOUNTER — Ambulatory Visit: Admitting: Student

## 2024-09-23 NOTE — Progress Notes (Unsigned)
" °  Electrophysiology Office Note:   Date:  09/24/2024  ID:  Mike Arias, DOB October 25, 1950, MRN [id number]  Primary Cardiologist: Aleene Passe, MD (Inactive) Electrophysiologist: OLE ONEIDA HOLTS, MD   Electrophysiologist:  OLE ONEIDA HOLTS, MD      History of Present Illness:   Mike Arias is a 74 y.o. male with h/o TIA, HLD, mild aortic root dilation, and paroxysmal atrial fibrillation seen today for routine electrophysiology followup.   Since last being seen in our clinic the patient reports doing well overall. Currently,  he denies chest pain, palpitations, dyspnea, PND, orthopnea, nausea, vomiting, dizziness, syncope, edema, weight gain, or early satiety.  He is in need of a colonoscopy, and has been having some left knee/hip pain.  Review of systems complete and found to be negative unless listed in HPI.   EP Information / Studies Reviewed:    EKG is ordered today. Personal review as below.  EKG Interpretation Date/Time:  Friday September 24 2024 08:49:50 EST Ventricular Rate:  54 PR Interval:  216 QRS Duration:  96 QT Interval:  396 QTC Calculation: 375 R Axis:   1  Text Interpretation: Sinus bradycardia with 1st degree A-V block Confirmed by Lesia Heck ([id number]) on 09/24/2024 8:52:50 AM    Arrhythmia/Device History S/p PVI and posterior wall ablation 03/09/2024   Physical Exam:   VS:  BP 130/86   Pulse (!) 54   Ht 6' (1.829 m)   Wt 190 lb (86.2 kg)   SpO2 96%   BMI 25.77 kg/m    Wt Readings from Last 3 Encounters:  09/24/24 190 lb (86.2 kg)  04/06/24 193 lb (87.5 kg)  03/09/24 193 lb (87.5 kg)     GEN: No acute distress NECK: No JVD; No carotid bruits CARDIAC: Regular rate and rhythm, no murmurs, rubs, gallops RESPIRATORY:  Clear to auscultation without rales, wheezing or rhonchi  ABDOMEN: Soft, non-tender, non-distended EXTREMITIES:  No edema; No deformity   ASSESSMENT AND PLAN:    Paroxysmal AF Secondary hypercoagulable state EKG today shows sinus  bradycardia with mild 1AVB  S/p ablation 03/09/2024 He has been off Eliquis , as he states he was told by someone that he could come off of it.  Resume eliquis  5 mg BID for CHA2DS2VASc of at least 3. He understands the conversation may have centered around no MISSED doses for 3 months.   H/o TIA Recommend lifelong OAC. He would ultimately be interested in [place]. Will have follow up with Dr. Almetta.   Cardiac Clearance for Colonoscopy No concerns from a cardiac perspective.  The patient is at low risk to proceed without further work up.  If the patient has new chest pain or SOB prior to surgery, they should be revaluated.    Follow up with Dr. Almetta in 4-6 months for follow up and to discuss Watchman  Signed, Ozell Prentice Lesia, PA-C  "

## 2024-09-24 ENCOUNTER — Ambulatory Visit: Attending: Student | Admitting: Student

## 2024-09-24 ENCOUNTER — Encounter: Payer: Self-pay | Admitting: Student

## 2024-09-24 VITALS — BP 130/86 | HR 54 | Ht 72.0 in | Wt 190.0 lb

## 2024-09-24 DIAGNOSIS — I480 Paroxysmal atrial fibrillation: Secondary | ICD-10-CM | POA: Diagnosis not present

## 2024-09-24 DIAGNOSIS — D6869 Other thrombophilia: Secondary | ICD-10-CM

## 2024-09-24 DIAGNOSIS — G459 Transient cerebral ischemic attack, unspecified: Secondary | ICD-10-CM

## 2024-09-24 MED ORDER — APIXABAN 5 MG PO TABS: 5.0000 mg | ORAL_TABLET | Freq: Two times a day (BID) | ORAL | 3 refills | Status: AC

## 2024-09-24 NOTE — Patient Instructions (Signed)
 Medication Instructions:  Resume Eliquis  *If you need a refill on your cardiac medications before your next appointment, please call your pharmacy*  Lab Work: No labwork ordered today. If you have labs (blood work) drawn today and your tests are completely normal, you will receive your results only by: MyChart Message (if you have MyChart) OR A paper copy in the mail If you have any lab test that is abnormal or we need to change your treatment, we will call you to review the results.  Testing/Procedures: No testing ordered today  Follow-Up: At [facility], you and your health needs are our priority.  As part of our continuing mission to provide you with exceptional heart care, our providers are all part of one team.  This team includes your primary Cardiologist (physician) and Advanced Practice Providers or APPs (Physician Assistants and Nurse Practitioners) who all work together to provide you with the care you need, when you need it.  Your next appointment:   4-6 month(s) to discuss Watchman  Provider:   Donnice DELENA Primus, MD  We recommend signing up for the patient portal called MyChart.  Sign up information is provided on this After Visit Summary.  MyChart is used to connect with patients for Virtual Visits (Telemedicine).  Patients are able to view lab/test results, encounter notes, upcoming appointments, etc.  Non-urgent messages can be sent to your provider as well.   To learn more about what you can do with MyChart, go to [email or web address].

## 2024-09-30 ENCOUNTER — Institutional Professional Consult (permissible substitution) (INDEPENDENT_AMBULATORY_CARE_PROVIDER_SITE_OTHER): Admitting: Otolaryngology

## 2024-10-27 ENCOUNTER — Ambulatory Visit (INDEPENDENT_AMBULATORY_CARE_PROVIDER_SITE_OTHER): Admitting: Otolaryngology

## 2024-10-27 ENCOUNTER — Encounter (INDEPENDENT_AMBULATORY_CARE_PROVIDER_SITE_OTHER): Payer: Self-pay | Admitting: Otolaryngology

## 2024-10-27 VITALS — BP 144/89 | HR 54 | Temp 97.3°F | Ht 72.0 in | Wt 190.0 lb

## 2024-10-27 DIAGNOSIS — H9313 Tinnitus, bilateral: Secondary | ICD-10-CM

## 2024-10-27 DIAGNOSIS — H903 Sensorineural hearing loss, bilateral: Secondary | ICD-10-CM
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
## 2024-11-15 ENCOUNTER — Ambulatory Visit (INDEPENDENT_AMBULATORY_CARE_PROVIDER_SITE_OTHER): Admitting: Otolaryngology

## 2024-11-25 ENCOUNTER — Encounter

## 2024-12-09 ENCOUNTER — Encounter: Admitting: Gastroenterology
